# Patient Record
Sex: Female | Born: 1935 | Race: White | Hispanic: No | Marital: Single | Smoking: Former smoker
Health system: Southern US, Community
[De-identification: ages and names within clinical notes are randomized; demographics above are authoritative.]

## PROBLEM LIST (undated history)

## (undated) DIAGNOSIS — I10 Essential (primary) hypertension: Secondary | ICD-10-CM

## (undated) DIAGNOSIS — E785 Hyperlipidemia, unspecified: Secondary | ICD-10-CM

## (undated) DIAGNOSIS — I639 Cerebral infarction, unspecified: Secondary | ICD-10-CM

## (undated) DIAGNOSIS — J45909 Unspecified asthma, uncomplicated: Secondary | ICD-10-CM

## (undated) DIAGNOSIS — R569 Unspecified convulsions: Secondary | ICD-10-CM

## (undated) DIAGNOSIS — M1990 Unspecified osteoarthritis, unspecified site: Secondary | ICD-10-CM

## (undated) HISTORY — PX: HIP SURGERY: SHX245

## (undated) HISTORY — PX: ABDOMINAL HYSTERECTOMY: SHX81

## (undated) HISTORY — PX: APPENDECTOMY: SHX54

## (undated) HISTORY — DX: Unspecified asthma, uncomplicated: J45.909

## (undated) HISTORY — PX: OTHER SURGICAL HISTORY: SHX169

## (undated) HISTORY — DX: Unspecified osteoarthritis, unspecified site: M19.90

## (undated) HISTORY — DX: Hyperlipidemia, unspecified: E78.5

## (undated) HISTORY — DX: Unspecified convulsions: R56.9

## (undated) HISTORY — DX: Essential (primary) hypertension: I10

## (undated) HISTORY — DX: Cerebral infarction, unspecified: I63.9

---

## 2012-12-29 DIAGNOSIS — G40309 Generalized idiopathic epilepsy and epileptic syndromes, not intractable, without status epilepticus: Secondary | ICD-10-CM | POA: Insufficient documentation

## 2013-03-30 DIAGNOSIS — I10 Essential (primary) hypertension: Secondary | ICD-10-CM | POA: Insufficient documentation

## 2013-03-30 DIAGNOSIS — J45909 Unspecified asthma, uncomplicated: Secondary | ICD-10-CM | POA: Insufficient documentation

## 2013-03-30 DIAGNOSIS — N393 Stress incontinence (female) (male): Secondary | ICD-10-CM | POA: Insufficient documentation

## 2013-07-05 DIAGNOSIS — M2550 Pain in unspecified joint: Secondary | ICD-10-CM | POA: Insufficient documentation

## 2013-10-20 DIAGNOSIS — J45901 Unspecified asthma with (acute) exacerbation: Secondary | ICD-10-CM | POA: Insufficient documentation

## 2013-11-14 DIAGNOSIS — E782 Mixed hyperlipidemia: Secondary | ICD-10-CM | POA: Insufficient documentation

## 2013-11-14 DIAGNOSIS — N183 Chronic kidney disease, stage 3 unspecified: Secondary | ICD-10-CM | POA: Insufficient documentation

## 2013-11-14 DIAGNOSIS — I129 Hypertensive chronic kidney disease with stage 1 through stage 4 chronic kidney disease, or unspecified chronic kidney disease: Secondary | ICD-10-CM

## 2013-11-14 DIAGNOSIS — E039 Hypothyroidism, unspecified: Secondary | ICD-10-CM | POA: Insufficient documentation

## 2013-12-25 DIAGNOSIS — E669 Obesity, unspecified: Secondary | ICD-10-CM | POA: Insufficient documentation

## 2014-03-19 DIAGNOSIS — R001 Bradycardia, unspecified: Secondary | ICD-10-CM | POA: Insufficient documentation

## 2014-06-18 DIAGNOSIS — G4700 Insomnia, unspecified: Secondary | ICD-10-CM | POA: Insufficient documentation

## 2014-10-15 DIAGNOSIS — M159 Polyosteoarthritis, unspecified: Secondary | ICD-10-CM | POA: Insufficient documentation

## 2014-10-15 DIAGNOSIS — M150 Primary generalized (osteo)arthritis: Secondary | ICD-10-CM

## 2014-11-13 DIAGNOSIS — R9431 Abnormal electrocardiogram [ECG] [EKG]: Secondary | ICD-10-CM | POA: Insufficient documentation

## 2014-11-16 DIAGNOSIS — R768 Other specified abnormal immunological findings in serum: Secondary | ICD-10-CM | POA: Insufficient documentation

## 2014-12-21 DIAGNOSIS — R42 Dizziness and giddiness: Secondary | ICD-10-CM | POA: Insufficient documentation

## 2014-12-31 DIAGNOSIS — J449 Chronic obstructive pulmonary disease, unspecified: Secondary | ICD-10-CM | POA: Insufficient documentation

## 2015-03-04 DIAGNOSIS — R942 Abnormal results of pulmonary function studies: Secondary | ICD-10-CM | POA: Insufficient documentation

## 2016-03-26 ENCOUNTER — Other Ambulatory Visit: Payer: Self-pay | Admitting: Internal Medicine

## 2016-03-26 DIAGNOSIS — Z1231 Encounter for screening mammogram for malignant neoplasm of breast: Secondary | ICD-10-CM

## 2016-03-27 DIAGNOSIS — R159 Full incontinence of feces: Secondary | ICD-10-CM | POA: Insufficient documentation

## 2016-03-27 DIAGNOSIS — K625 Hemorrhage of anus and rectum: Secondary | ICD-10-CM | POA: Insufficient documentation

## 2016-04-30 ENCOUNTER — Ambulatory Visit
Admission: RE | Admit: 2016-04-30 | Discharge: 2016-04-30 | Disposition: A | Discharge status: Home / Self Care | Payer: Medicare Other | Source: Ambulatory Visit | Attending: Internal Medicine | Admitting: Internal Medicine

## 2016-04-30 DIAGNOSIS — Z1231 Encounter for screening mammogram for malignant neoplasm of breast: Secondary | ICD-10-CM | POA: Insufficient documentation

## 2016-06-04 ENCOUNTER — Other Ambulatory Visit: Payer: Self-pay | Admitting: Internal Medicine

## 2016-06-04 ENCOUNTER — Other Ambulatory Visit: Payer: Self-pay | Admitting: Legal Medicine

## 2016-06-04 DIAGNOSIS — N6320 Unspecified lump in the left breast, unspecified quadrant: Secondary | ICD-10-CM

## 2016-06-04 DIAGNOSIS — N6310 Unspecified lump in the right breast, unspecified quadrant: Secondary | ICD-10-CM

## 2016-06-19 ENCOUNTER — Ambulatory Visit
Admission: RE | Admit: 2016-06-19 | Discharge: 2016-06-19 | Disposition: A | Discharge status: Home / Self Care | Payer: Medicare Other | Source: Ambulatory Visit | Attending: Legal Medicine | Admitting: Legal Medicine

## 2016-06-19 DIAGNOSIS — N63 Unspecified lump in breast: Secondary | ICD-10-CM | POA: Diagnosis not present

## 2016-06-19 DIAGNOSIS — N6310 Unspecified lump in the right breast, unspecified quadrant: Secondary | ICD-10-CM

## 2016-06-19 DIAGNOSIS — N6320 Unspecified lump in the left breast, unspecified quadrant: Secondary | ICD-10-CM

## 2016-10-09 ENCOUNTER — Encounter: Payer: Self-pay | Admitting: Urology

## 2016-10-09 ENCOUNTER — Ambulatory Visit (INDEPENDENT_AMBULATORY_CARE_PROVIDER_SITE_OTHER): Payer: Medicare Other | Admitting: Urology

## 2016-10-09 VITALS — BP 150/82 | HR 76 | Ht 66.0 in | Wt 201.0 lb

## 2016-10-09 DIAGNOSIS — R32 Unspecified urinary incontinence: Secondary | ICD-10-CM

## 2016-10-09 DIAGNOSIS — R3915 Urgency of urination: Secondary | ICD-10-CM

## 2016-10-09 DIAGNOSIS — R351 Nocturia: Secondary | ICD-10-CM | POA: Diagnosis not present

## 2016-10-09 LAB — BLADDER SCAN AMB NON-IMAGING: Scan Result: 90

## 2016-10-09 NOTE — Progress Notes (Signed)
10/09/2016 5:44 PM   Cheryl SachsGeneva H Yankee 1936/03/15 [id number]  Referring provider: Jordan Hawksushad Darius Shroff, MD [address] [facility] [place], [place] [address]  Chief Complaint  Patient presents with  . New Patient (Initial Visit)    urinary incontinence referred by Orpah Melterushad Schroff    HPI: Patient is a 80 year old Caucasian female who is referred to us by, Dr. Octaviano GlowShroff, for urinary urgency.  Patient states that she has had urinary urgency for the a long time, but it has been gradually getting worse for the last two years.  She is not having incontinence.  She is not wearing pads.    She is having associated urinary frequency, urgency, nocturia x 6-8 and weak urinary stream.   She does not have a history of urinary tract infections, STI's or injury to the bladder.   She denies dysuria, gross hematuria, suprapubic pain, back pain, abdominal pain or flank pain. She has not had any recent fevers, chills, nausea or vomiting.   She does have a history of bladder taking in 1961, she does not have a history of nephrolithiasis or GU trauma.   She is post menopausal.   She feels she is not evacuating her bowels completely as she frequency finds stool smears in her underwear.    She is not having pain with bladder filling.    She has not had any recent imaging studies.    She has reduced her water intake in an effect to control her urgency.   She is drinking 1 to 2 caffeinated beverages daily.  She is not drinking alcoholic beverages.    Her risk factors for incontinence are obesity, age, caffeine, stroke, depression, fecal incontinence, vaginal atrophy and pelvic surgery.    She is taking OAB medication, ACE inhibitors, diuretics and antidepressants.    She is bothered a very great deal with a comfortable urge to urinate, sudden urge to urinate with little or no warning, waking up at night because she had to urinate and urine loss associated with a strong desire to urinate.     Her PVR is 90 mL.    PMH: Past Medical History:  Diagnosis Date  . Arthritis   . Asthma   . HLD (hyperlipidemia)   . HTN (hypertension)   . Seizures (HCC)   . Stroke [facility](HCC)     Surgical History: Past Surgical History:  Procedure Laterality Date  . ABDOMINAL HYSTERECTOMY     also one ovary taken  . APPENDECTOMY    . suspension of bladder     1961    Home Medications:    Medication List       Accurate as of 10/09/16 11:59 PM. Always use your most recent med list.          acetaminophen 500 MG tablet Commonly known as:  TYLENOL Take by mouth.   ADVAIR DISKUS 100-50 MCG/DOSE Aepb Generic drug:  Fluticasone-Salmeterol Inhale into the lungs.   albuterol 108 (90 Base) MCG/ACT inhaler Commonly known as:  PROVENTIL HFA;VENTOLIN HFA Inhale into the lungs.   ALIGN 4 MG Caps Take by mouth.   aspirin EC 81 MG tablet Take 81 mg by mouth.   Calcium Carbonate-Vitamin D3 600-400 MG-UNIT Tabs Take by mouth.   CENTRUM SILVER PO Take by mouth.   cetirizine 10 MG tablet Commonly known as:  ZYRTEC Take by mouth.   chlorhexidine 0.12 % solution Commonly known as:  PERIDEX as needed.   citalopram 10 MG tablet Commonly known  as:  CELEXA Take 10 mg by mouth.   Coenzyme Q10 200 MG capsule Take by mouth.   D 2000 2000 units Tabs Generic drug:  Cholecalciferol Take by mouth.   fesoterodine 4 MG Tb24 tablet Commonly known as:  TOVIAZ Take by mouth.   Garlic 1000 MG Caps Take by mouth.   Glucosamine-Chondroitin 500-400 MG Caps Take by mouth.   levETIRAcetam 250 MG tablet Commonly known as:  KEPPRA Take by mouth.   levothyroxine 88 MCG tablet Commonly known as:  SYNTHROID, LEVOTHROID Take by mouth.   losartan-hydrochlorothiazide 100-25 MG tablet Commonly known as:  HYZAAR Take by mouth.   MULTI-VITAMINS Tabs Take by mouth.   omeprazole 40 MG capsule Commonly known as:  PRILOSEC Take by mouth.   PSYLLIUM PO Take by mouth.   simvastatin 20  MG tablet Commonly known as:  ZOCOR Take 20 mg by mouth.   SM MULTIPLE VITAMINS/IRON Tabs Take by mouth.   sodium chloride 0.65 % nasal spray Commonly known as:  OCEAN Place into the nose.   Turmeric Curcumin 500 MG Caps Take by mouth.   vitamin E 400 UNIT capsule Take by mouth.       Allergies:  Allergies  Allergen Reactions  . Neomycin-Bacitracin Zn-Polymyx Hives    Family History: Family History  Problem Relation Age of Onset  . Breast cancer Neg Hx   . Prostate cancer Neg Hx   . Bladder Cancer Neg Hx   . Kidney cancer Neg Hx     Social History:  reports that she quit smoking about 50 years ago. She has never used smokeless tobacco. She reports that she drinks alcohol. She reports that she does not use drugs.  ROS: UROLOGY Frequent Urination?: Yes Hard to postpone urination?: Yes Burning/pain with urination?: No Get up at night to urinate?: Yes Leakage of urine?: Yes Urine stream starts and stops?: No Trouble starting stream?: No Do you have to strain to urinate?: No Blood in urine?: No Urinary tract infection?: No Sexually transmitted disease?: No Injury to kidneys or bladder?: No Painful intercourse?: No Weak stream?: Yes Currently pregnant?: No Vaginal bleeding?: No Last menstrual period?: n  Gastrointestinal Nausea?: No Vomiting?: No Indigestion/heartburn?: No Diarrhea?: No Constipation?: No  Constitutional Fever: No Night sweats?: No Weight loss?: No Fatigue?: No  Skin Skin rash/lesions?: No Itching?: No  Eyes Blurred vision?: No Double vision?: No  Ears/Nose/Throat Sore throat?: No Sinus problems?: No  Hematologic/Lymphatic Swollen glands?: No Easy bruising?: Yes  Cardiovascular Leg swelling?: No Chest pain?: No  Respiratory Cough?: Yes Shortness of breath?: No  Endocrine Excessive thirst?: No  Musculoskeletal Back pain?: No Joint pain?: Yes  Neurological Headaches?: No Dizziness?:  No  Psychologic Depression?: No Anxiety?: No  Physical Exam: BP (!) 150/82   Pulse 76   Ht 5\' 6"  (1.676 m)   Wt 201 lb (91.2 kg)   BMI 32.44 kg/m   Constitutional: Well nourished. Alert and oriented, No acute distress. HEENT: [place] AT, moist mucus membranes. Trachea midline, no masses. Cardiovascular: No clubbing, cyanosis, or edema. Respiratory: Normal respiratory effort, no increased work of breathing. GI: Abdomen is soft, non tender, non distended, no abdominal masses. Liver and spleen not palpable.  No hernias appreciated.  Stool sample for occult testing is not indicated.   GU: No CVA tenderness.  No bladder fullness or masses.  Atrophic external genitalia, normal pubic hair distribution, no lesions.  Normal urethral meatus, no lesions, no prolapse, no discharge.   No urethral masses, tenderness and/or  tenderness. No bladder fullness, tenderness or masses. Pale vagina mucosa, poor estrogen effect, no discharge, no lesions, good pelvic support, Grade I cystocele is noted.  Rectocele is noted.  Cervix and uterus are surgically absent.  No pelvic masses noted.  Anus and perineum are without rashes or lesions.    Skin: No rashes, bruises or suspicious lesions. Lymph: No cervical or inguinal adenopathy. Neurologic: Grossly intact, no focal deficits, moving all 4 extremities. Psychiatric: Normal mood and affect.   Pertinent Imaging: Results for Cheryl SachsDAVIS, Emmalynn H (MRN [id number]) as of 10/11/2016 17:38  Ref. Range 10/09/2016 09:48  Scan Result Unknown 90    Assessment & Plan:    1. Urgency  - offered behavioral therapies, bladder training, bladder control strategies, pelvic floor muscle training - patient declined  - fluid management - encouraged patient not to decrease her fluids  - offered medical therapy with anticholinergic therapy or beta-3 adrenergic receptor agonist and the potential side effects of each therapy   - would like to try the beta-3 adrenergic receptor agonist  (Myrbetriq).  Given Myrbetriq 25 mg samples, #28.  I have reviewed with the patient of the side effects of Myrbetriq, such as: elevation in BP, urinary retention and/or HA.  She will return in one month for PVR and symptom recheck.   - RTC in 3 weeks for PVR and symptom recheck   - BLADDER SCAN AMB NON-IMAGING  2. Nocturia  - I explained to the patient that nocturia is often multi-factorial and difficult to treat.  Sleeping disorders, heart conditions, peripheral vascular disease, diabetes, an enlarged prostate for men, an urethral stricture causing bladder outlet obstruction and/or certain medications can contribute to nocturia.  - I have suggested that the patient avoid caffeine after noon and alcohol in the evening.  He or she may also benefit from fluid restrictions after 6:00 in the evening and voiding just prior to bedtime.  - I have explained that research studies have showed that over 84% of patients with sleep apnea reported frequent nighttime urination.   With sleep apnea, oxygen decreases, carbon dioxide increases, the blood become more acidic, the heart rate drops and blood vessels in the lung constrict.  The body is then alerted that something is very wrong. The sleeper must wake enough to reopen the airway. By this time, the heart is racing and experiences a false signal of fluid overload. The heart excretes a hormone-like protein that tells the body to get rid of sodium and water, resulting in nocturia.  -  I also informed the patient that a recent study noted that decreasing sodium intake to 2.3 grams daily, if they don't have issues with hyponatremia, can also reduce the number of nightly voids  - The patient may benefit from a discussion with his or her primary care physician to see if he or she has risk factors for sleep apnea or other sleep disturbances and obtaining a sleep study.   Return in about 3 weeks (around 10/30/2016) for PVR and OAB questionnaire.  These notes generated  with voice recognition software. I apologize for typographical errors.  Michiel CowboySHANNON Vaughn Beaumier, PA-C  [facility] [address], [address] [place], [place] [address] [phone]

## 2016-11-06 ENCOUNTER — Ambulatory Visit: Payer: Medicare Other | Admitting: Urology

## 2016-11-13 ENCOUNTER — Encounter: Payer: Self-pay | Admitting: Urology

## 2016-11-13 ENCOUNTER — Ambulatory Visit (INDEPENDENT_AMBULATORY_CARE_PROVIDER_SITE_OTHER): Payer: Medicare Other | Admitting: Urology

## 2016-11-13 ENCOUNTER — Telehealth: Payer: Self-pay | Admitting: Urology

## 2016-11-13 VITALS — BP 139/77 | HR 83 | Ht 66.0 in | Wt 205.0 lb

## 2016-11-13 DIAGNOSIS — R351 Nocturia: Secondary | ICD-10-CM

## 2016-11-13 DIAGNOSIS — R3915 Urgency of urination: Secondary | ICD-10-CM

## 2016-11-13 LAB — BLADDER SCAN AMB NON-IMAGING: SCAN RESULT: 88

## 2016-11-13 NOTE — Progress Notes (Signed)
11/13/2016 9:06 AM   Cheryl Huff 1936-02-03 [id number]  Referring provider: Jordan Hawks, MD [address] [place] [address] [place], [place] [address]  Chief Complaint  Patient presents with  . Follow-up    3 weeks for urinary urgency and nocturia    HPI: Patient is a 81 year old Caucasian female who presents today for a 3 week follow up after a trial of Toviaz for her urinary urgency and nocturia.    Background history Patient was referred to [place] by, Dr. Octaviano Glow, for urinary urgency.  Patient states that she has had urinary urgency for the a long time, but it has been gradually getting worse for the last two years.  She is not having incontinence.  She is not wearing pads.  She is having associated urinary frequency, urgency, nocturia x 6-8 and weak urinary stream.   She does not have a history of urinary tract infections, STI's or injury to the bladder.   She denies dysuria, gross hematuria, suprapubic pain, back pain, abdominal pain or flank pain.  She has not had any recent fevers, chills, nausea or vomiting.  She does have a history of bladder taking in 1961, she does not have a history of nephrolithiasis or GU trauma.   She is post menopausal.  She feels she is not evacuating her bowels completely as she frequency finds stool smears in her underwear.  She is not having pain with bladder filling.  She has not had any recent imaging studies.  She has reduced her water intake in an effect to control her urgency.   She is drinking 1 to 2 caffeinated beverages daily.  She is not drinking alcoholic beverages.  Her risk factors for incontinence are obesity, age, caffeine, stroke, depression, fecal incontinence, vaginal atrophy and pelvic surgery.  She is taking OAB medication, ACE inhibitors, diuretics and antidepressants.  She is bothered a very great deal with a comfortable urge to urinate, sudden urge to urinate with little or no warning, waking up at night because she had to  urinate and urine loss associated with a strong desire to urinate.  Her PVR is 90 mL.    At her visit three weeks ago, she was started on Toviaz daily.  She showed signs of confusion and forgetfulness at today visit.  She did not recall having been to the office three weeks ago.    Today, she states that 7 times a day she needs to rush to the bathroom to urinate worried that she will make it, she makes 7 trips to the bathroom daily to urinate, she restricts her fluid intake 3 days a week in an effort to stave off urination, she does not engage in toilet mapping, she's had 3 episodes of incontinence this week and gets up 7 times a night to urinate.  She states the Gala Murdoch did not provide any improvement in her urinary symptoms.  She states she did not experience an increase in her dry mouth or constipation.  Her PVR today is 80 mL.    She perseverates on her bowel issues and it is difficult to keep her focus on her urinary issues.    She was also instructed to speak with Dr. Octaviano Glow about her sleep apnea.  She has not discussed her sleep apnea issues with Dr. Octaviano Glow.   PMH: Past Medical History:  Diagnosis Date  . Arthritis   . Asthma   . HLD (hyperlipidemia)   . HTN (hypertension)   .  Seizures (HCC)   . Stroke [facility])     Surgical History: Past Surgical History:  Procedure Laterality Date  . ABDOMINAL HYSTERECTOMY     also one ovary taken  . APPENDECTOMY    . HIP SURGERY Right    3 pins put in from fall cracking hip  . suspension of bladder     1961    Home Medications:  Allergies as of 11/13/2016      Reactions   Neomycin-bacitracin Zn-polymyx Hives      Medication List       Accurate as of 11/13/16  9:06 AM. Always use your most recent med list.          acetaminophen 500 MG tablet Commonly known as:  TYLENOL Take by mouth.   ADVAIR DISKUS 100-50 MCG/DOSE Aepb Generic drug:  Fluticasone-Salmeterol Inhale into the lungs.   albuterol 108 (90 Base) MCG/ACT  inhaler Commonly known as:  PROVENTIL HFA;VENTOLIN HFA Inhale into the lungs.   ALIGN 4 MG Caps Take by mouth.   aspirin EC 81 MG tablet Take 81 mg by mouth.   Calcium Carbonate-Vitamin D3 600-400 MG-UNIT Tabs Take by mouth.   CENTRUM SILVER PO Take by mouth.   cetirizine 10 MG tablet Commonly known as:  ZYRTEC Take by mouth.   chlorhexidine 0.12 % solution Commonly known as:  PERIDEX as needed.   citalopram 10 MG tablet Commonly known as:  CELEXA Take 10 mg by mouth.   Coenzyme Q10 200 MG capsule Take by mouth.   D 2000 2000 units Tabs Generic drug:  Cholecalciferol Take by mouth.   fesoterodine 4 MG Tb24 tablet Commonly known as:  TOVIAZ Take by mouth.   Garlic 1000 MG Caps Take by mouth.   Glucosamine-Chondroitin 500-400 MG Caps Take by mouth.   levETIRAcetam 250 MG tablet Commonly known as:  KEPPRA Take by mouth.   levothyroxine 88 MCG tablet Commonly known as:  SYNTHROID, LEVOTHROID Take by mouth.   losartan-hydrochlorothiazide 100-25 MG tablet Commonly known as:  HYZAAR Take by mouth.   MULTI-VITAMINS Tabs Take by mouth.   omeprazole 40 MG capsule Commonly known as:  PRILOSEC Take by mouth.   PSYLLIUM PO Take by mouth.   simvastatin 20 MG tablet Commonly known as:  ZOCOR Take 20 mg by mouth.   SM MULTIPLE VITAMINS/IRON Tabs Take by mouth.   sodium chloride 0.65 % nasal spray Commonly known as:  OCEAN Place into the nose.   Turmeric Curcumin 500 MG Caps Take by mouth.   vitamin E 400 UNIT capsule Take by mouth.       Allergies:  Allergies  Allergen Reactions  . Neomycin-Bacitracin Zn-Polymyx Hives    Family History: Family History  Problem Relation Age of Onset  . Pancreatic cancer Mother   . Breast cancer Neg Hx   . Prostate cancer Neg Hx   . Bladder Cancer Neg Hx   . Kidney cancer Neg Hx     Social History:  reports that she quit smoking about 51 years ago. She has never used smokeless tobacco. She reports  that she drinks alcohol. She reports that she does not use drugs.  ROS: UROLOGY Frequent Urination?: Yes Hard to postpone urination?: Yes Burning/pain with urination?: No Get up at night to urinate?: Yes Leakage of urine?: Yes Urine stream starts and stops?: Yes Trouble starting stream?: No Do you have to strain to urinate?: No Blood in urine?: No Urinary tract infection?: No Sexually transmitted disease?: No Injury to kidneys or  bladder?: No Painful intercourse?: No Weak stream?: No Currently pregnant?: No Vaginal bleeding?: No Last menstrual period?: n  Gastrointestinal Nausea?: No Vomiting?: No Indigestion/heartburn?: No Diarrhea?: No Constipation?: No  Constitutional Fever: No Night sweats?: No Weight loss?: No Fatigue?: No  Skin Skin rash/lesions?: No Itching?: No  Eyes Blurred vision?: No Double vision?: No  Ears/Nose/Throat Sore throat?: No Sinus problems?: No  Hematologic/Lymphatic Swollen glands?: No Easy bruising?: No  Cardiovascular Leg swelling?: No Chest pain?: No  Respiratory Cough?: Yes Shortness of breath?: Yes  Endocrine Excessive thirst?: Yes  Musculoskeletal Back pain?: No Joint pain?: Yes  Neurological Headaches?: No Dizziness?: Yes  Psychologic Depression?: No Anxiety?: No  Physical Exam: BP 139/77   Pulse 83   Ht 5\' 6"  (1.676 m)   Wt 205 lb (93 kg)   BMI 33.09 kg/m   Constitutional: Well nourished. Alert and oriented, No acute distress. HEENT: [place] AT, moist mucus membranes. Trachea midline, no masses. Cardiovascular: No clubbing, cyanosis, or edema. Respiratory: Normal respiratory effort, no increased work of breathing. Skin: No rashes, bruises or suspicious lesions. Lymph: No cervical or inguinal adenopathy. Neurologic: Grossly intact, no focal deficits, moving all 4 extremities. Psychiatric: Normal mood and affect.   Pertinent Imaging: Results for Cheryl SachsDAVIS, Sabella H (MRN [id number]) as of 11/13/2016  09:00  Ref. Range 11/13/2016 08:44  Scan Result Unknown 88     Assessment & Plan:    1. Urgency  - failed Toviaz 4 mg daily  - does not want to try another medication at this time as she is fearful she may not urinate at all  - BLADDER SCAN AMB NON-IMAGING  - will reassess once she has her sleep study  2. Nocturia  - still has not spoken to her PCP regarding her possible sleep apnea  - will reassess once she has her sleep study  Return for follow up pending sleep study.  These notes generated with voice recognition software. I apologize for typographical errors.  Michiel CowboySHANNON Kenard Morawski, PA-C  [facility] [address], [address] [place], [place] [address] [phone]

## 2016-11-13 NOTE — Telephone Encounter (Signed)
DONE

## 2016-11-13 NOTE — Telephone Encounter (Signed)
Will you send my last two notes to Dr. Octaviano GlowShroff?

## 2016-11-13 NOTE — Patient Instructions (Addendum)
Pelvic Organ Prolapse Introduction Pelvic organ prolapse is the stretching, bulging, or dropping of pelvic organs into an abnormal position. It happens when the muscles and tissues that surround and support pelvic structures are stretched or weak. Pelvic organ prolapse can involve:  Vagina (vaginal prolapse).  Uterus (uterine prolapse).  Bladder (cystocele).  Rectum (rectocele).  Intestines (enterocele). When organs other than the vagina are involved, they often bulge into the vagina or protrude from the vagina, depending on how severe the prolapse is. What are the causes? Causes of this condition include:  Pregnancy, labor, and childbirth.  Long-lasting (chronic) cough.  Chronic constipation.  Obesity.  Past pelvic surgery.  Aging. During and after menopause, a decreased production of the hormone estrogen can weaken pelvic ligaments and muscles.  Consistently lifting more than 50 lb (23 kg).  Buildup of fluid in the abdomen due to certain diseases and other conditions. What are the signs or symptoms? Symptoms of this condition include:  Loss of bladder control when you cough, sneeze, strain, and exercise (stress incontinence). This may be worse immediately following childbirth, and it may gradually improve over time.  Feeling pressure in your pelvis or vagina. This pressure may increase when you cough or when you are having a bowel movement.  A bulge that protrudes from the opening of your vagina or against your vaginal wall. If your uterus protrudes through the opening of your vagina and rubs against your clothing, you may also experience soreness, ulcers, infection, pain, and bleeding.  Increased effort to have a bowel movement or urinate.  Pain in your low back.  Pain, discomfort, or disinterest in sexual intercourse.  Repeated bladder infections (urinary tract infections).  Difficulty inserting or inability to insert a tampon or applicator. In some people, this  condition does not cause any symptoms. How is this diagnosed? Your health care provider may perform an internal and external vaginal and rectal exam. During the exam, you may be asked to cough and strain while you are lying down, sitting, and standing up. Your health care provider will determine if other tests are required, such as bladder function tests. How is this treated? In most cases, this condition needs to be treated only if it produces symptoms. No treatment is guaranteed to correct the prolapse or relieve the symptoms completely. Treatment may include:  Lifestyle changes, such as:  Avoiding drinking beverages that contain caffeine.  Increasing your intake of high-fiber foods. This can help to decrease constipation and straining during bowel movements.  Emptying your bladder at scheduled times (bladder training therapy). This can help to reduce or avoid urinary incontinence.  Losing weight if you are overweight or obese.  Estrogen. Estrogen may help mild prolapse by increasing the strength and tone of pelvic floor muscles.  Kegel exercises. These may help mild cases of prolapse by strengthening and tightening the muscles of the pelvic floor.  Pessary insertion. A pessary is a soft, flexible device that is placed into your vagina by your health care provider to help support the vaginal walls and keep pelvic organs in place.  Surgery. This is often the only form of treatment for severe prolapse. Different types of surgeries are available. Follow these instructions at home:  Wear a sanitary pad or absorbent product if you have urinary incontinence.  Avoid heavy lifting and straining with exercise and work. Do not hold your breath when you perform mild to moderate lifting and exercise activities. Limit your activities as directed by your health care provider.  Take   medicines only as directed by your health care provider.  Perform Kegel exercises as directed by your health care  provider.  If you have a pessary, take care of it as directed by your health care provider. Contact a health care provider if:  Your symptoms interfere with your daily activities or sex life.  You need medicine to help with the discomfort.  You notice bleeding from the vagina that is not related to your period.  You have a fever.  You have pain or bleeding when you urinate.  You have bleeding when you have a bowel movement.  You lose urine when you have sex.  You have chronic constipation.  You have a pessary that falls out.  You have vaginal discharge that has a bad smell.  You have low abdominal pain or cramping that is unusual for you. This information is not intended to replace advice given to you by your health care provider. Make sure you discuss any questions you have with your health care provider. Document Released: 05/16/2014 Document Revised: 03/26/2016 Document Reviewed: 01/01/2014  2017 [employer]vier  

## 2016-11-24 ENCOUNTER — Ambulatory Visit: Payer: Medicare Other | Admitting: Gastroenterology

## 2016-11-26 ENCOUNTER — Ambulatory Visit (INDEPENDENT_AMBULATORY_CARE_PROVIDER_SITE_OTHER): Payer: Medicare Other | Admitting: Gastroenterology

## 2016-11-26 ENCOUNTER — Encounter: Payer: Self-pay | Admitting: Gastroenterology

## 2016-11-26 VITALS — BP 142/73 | HR 72 | Temp 98.0°F | Ht 66.0 in | Wt 207.0 lb

## 2016-11-26 DIAGNOSIS — F329 Major depressive disorder, single episode, unspecified: Secondary | ICD-10-CM | POA: Insufficient documentation

## 2016-11-26 DIAGNOSIS — R194 Change in bowel habit: Secondary | ICD-10-CM | POA: Diagnosis not present

## 2016-11-26 DIAGNOSIS — E039 Hypothyroidism, unspecified: Secondary | ICD-10-CM | POA: Insufficient documentation

## 2016-11-26 DIAGNOSIS — K219 Gastro-esophageal reflux disease without esophagitis: Secondary | ICD-10-CM | POA: Insufficient documentation

## 2016-11-26 DIAGNOSIS — J449 Chronic obstructive pulmonary disease, unspecified: Secondary | ICD-10-CM | POA: Insufficient documentation

## 2016-11-26 DIAGNOSIS — F419 Anxiety disorder, unspecified: Secondary | ICD-10-CM | POA: Insufficient documentation

## 2016-11-26 DIAGNOSIS — M1990 Unspecified osteoarthritis, unspecified site: Secondary | ICD-10-CM | POA: Insufficient documentation

## 2016-11-26 DIAGNOSIS — F32A Depression, unspecified: Secondary | ICD-10-CM | POA: Insufficient documentation

## 2016-11-26 DIAGNOSIS — G40909 Epilepsy, unspecified, not intractable, without status epilepticus: Secondary | ICD-10-CM | POA: Insufficient documentation

## 2016-11-26 DIAGNOSIS — E876 Hypokalemia: Secondary | ICD-10-CM | POA: Insufficient documentation

## 2016-11-26 DIAGNOSIS — T7840XA Allergy, unspecified, initial encounter: Secondary | ICD-10-CM | POA: Insufficient documentation

## 2016-11-26 DIAGNOSIS — R197 Diarrhea, unspecified: Secondary | ICD-10-CM | POA: Diagnosis not present

## 2016-11-26 DIAGNOSIS — E785 Hyperlipidemia, unspecified: Secondary | ICD-10-CM | POA: Insufficient documentation

## 2016-11-26 NOTE — Progress Notes (Signed)
Gastroenterology Consultation  Referring Provider:     Jordan Hawks, * Primary Care Physician:  [facility], Clancy Gourd, MD Primary Gastroenterologist:  Dr. Servando Snare     Reason for Consultation:     Change in bowel habits        HPI:   Cheryl Huff is a 81 y.o. y/o female referred for consultation & management of Change in bowel habits by Dr. Octaviano Glow, Clancy Gourd, MD.  This patient comes today with a report of change in bowel habits. The patient states that she has had loose bowel movements for the last year off and on. She states that she has not had any problems for last 3 weeks. The patient has been taking some fiber wafer supplementation and states that the taste is bad and she doesn't take it as often as she should. The patient has not had any weight loss black stools bloody stools nausea or vomiting. The patient take she had a colonoscopy 52 and then again at 62 but if she is not sure. She states that she was told she doesn't need any further colonoscopies. It is no report of any family history of colon cancer colon polyps. The patient denies any abdominal pain associated with her loose bowel movements. The patient has had some small amount of feces on her underwear that she says has slipped out without her knowing. She also reports that when she has a soft stools she has to wipe multiple times to get clean.  Past Medical History:  Diagnosis Date  . Arthritis   . Asthma   . HLD (hyperlipidemia)   . HTN (hypertension)   . Seizures (HCC)   . Stroke [facility])     Past Surgical History:  Procedure Laterality Date  . ABDOMINAL HYSTERECTOMY     also one ovary taken  . APPENDECTOMY    . HIP SURGERY Right    3 pins put in from fall cracking hip  . suspension of bladder     1961    Prior to Admission medications   Medication Sig Start Date End Date Taking? Authorizing Provider  acetaminophen (TYLENOL) 500 MG tablet Take by mouth.   Yes Historical Provider, MD  albuterol  (PROVENTIL HFA;VENTOLIN HFA) 108 (90 Base) MCG/ACT inhaler Inhale into the lungs. 10/20/13  Yes Historical Provider, MD  aspirin EC 81 MG tablet Take 81 mg by mouth.   Yes Historical Provider, MD  Calcium Carbonate-Vitamin D3 600-400 MG-UNIT TABS Take by mouth.   Yes Historical Provider, MD  cetirizine (ZYRTEC) 10 MG tablet Take by mouth.   Yes Historical Provider, MD  chlorhexidine (PERIDEX) 0.12 % solution as needed. 09/05/13  Yes Historical Provider, MD  Cholecalciferol (D 2000) 2000 units TABS Take by mouth.   Yes Historical Provider, MD  citalopram (CELEXA) 10 MG tablet Take 10 mg by mouth. 06/18/14  Yes Historical Provider, MD  Coenzyme Q10 200 MG capsule Take by mouth.   Yes Historical Provider, MD  fesoterodine (TOVIAZ) 4 MG TB24 tablet Take by mouth. 06/19/13  Yes Historical Provider, MD  Fluticasone-Salmeterol (ADVAIR DISKUS) 100-50 MCG/DOSE AEPB Inhale into the lungs. 10/20/13  Yes Historical Provider, MD  Garlic 1000 MG CAPS Take by mouth.   Yes Historical Provider, MD  Glucosamine-Chondroitin 500-400 MG CAPS Take by mouth.   Yes Historical Provider, MD  levETIRAcetam (KEPPRA) 250 MG tablet Take by mouth. 08/04/16  Yes Historical Provider, MD  levothyroxine (SYNTHROID, LEVOTHROID) 88 MCG tablet Take by mouth. 08/04/16  Yes Historical  Provider, MD  losartan-hydrochlorothiazide (HYZAAR) 100-25 MG tablet Take by mouth. 08/04/16  Yes Historical Provider, MD  Multiple Vitamin (MULTI-VITAMINS) TABS Take by mouth.   Yes Historical Provider, MD  Multiple Vitamins-Minerals (CENTRUM SILVER PO) Take by mouth.   Yes Historical Provider, MD  omeprazole (PRILOSEC) 40 MG capsule Take by mouth. 08/04/16  Yes Historical Provider, MD  Probiotic Product (ALIGN) 4 MG CAPS Take by mouth.   Yes Historical Provider, MD  PSYLLIUM PO Take by mouth.   Yes Historical Provider, MD  simvastatin (ZOCOR) 20 MG tablet Take 20 mg by mouth. 06/18/14  Yes Historical Provider, MD  SM MULTIPLE VITAMINS/IRON TABS Take by mouth.    Yes Historical Provider, MD  sodium chloride (OCEAN) 0.65 % nasal spray Place into the nose.   Yes Historical Provider, MD  Turmeric Curcumin 500 MG CAPS Take by mouth.   Yes Historical Provider, MD  vitamin E 400 UNIT capsule Take by mouth.   Yes Historical Provider, MD    Family History  Problem Relation Age of Onset  . Pancreatic cancer Mother   . Breast cancer Neg Hx   . Prostate cancer Neg Hx   . Bladder Cancer Neg Hx   . Kidney cancer Neg Hx      Social History  Substance Use Topics  . Smoking status: Former Smoker    Quit date: 11/02/1965  . Smokeless tobacco: Never Used  . Alcohol use Yes     Comment: occ    Allergies as of 11/26/2016 - Review Complete 11/26/2016  Allergen Reaction Noted  . Neomycin-bacitracin zn-polymyx Hives 11/14/2013    Review of Systems:    All systems reviewed and negative except where noted in HPI.   Physical Exam:  BP (!) 142/73   Pulse 72   Temp 98 F (36.7 C) (Oral)   Ht 5\' 6"  (1.676 m)   Wt 207 lb (93.9 kg)   BMI 33.41 kg/m  No LMP recorded. Patient has had a hysterectomy. Psych:  Alert and cooperative. Normal mood and affect. General:   Alert,  Well-developed, well-nourished, pleasant and cooperative in NAD Head:  Normocephalic and atraumatic. Eyes:  Sclera clear, no icterus.   Conjunctiva pink. Ears:  Normal auditory acuity. Nose:  No deformity, discharge, or lesions. Mouth:  No deformity or lesions,oropharynx pink & moist. Neck:  Supple; no masses or thyromegaly. Lungs:  Respirations even and unlabored.  Clear throughout to auscultation.   No wheezes, crackles, or rhonchi. No acute distress. Heart:  Regular rate and rhythm; no murmurs, clicks, rubs, or gallops. Abdomen:  Normal bowel sounds.  No bruits.  Soft, non-tender and non-distended without masses, hepatosplenomegaly or hernias noted.  No guarding or rebound tenderness.  Negative Carnett sign.   Rectal:  Deferred.  Msk:  Symmetrical without gross deformities.  Good,  equal movement & strength bilaterally. Pulses:  Normal pulses noted. Extremities:  No clubbing or edema.  No cyanosis. Neurologic:  Alert and oriented x3;  grossly normal neurologically. Skin:  Intact without significant lesions or rashes.  No jaundice. Lymph Nodes:  No significant cervical adenopathy. Psych:  Alert and cooperative. Normal mood and affect.  Imaging Studies: No results found.  Assessment and Plan:   Cheryl Huff is a 81 y.o. y/o female who comes in today with a history of off and on loose stools. The patient reports that she does drink a lot of milk and lots milk. She has been told that there products can cause loose bowel movements. The patient  also reports that she seems to be better with fiber which leads me to think that this is not an organic disease. She also has the symptoms, and go which is more likely related to something she is exposed to likely milk products or something she is eating. The patient has been given samples of powdered Metamucil to see if this is more tolerable to take. She will continue the fiber daily and see if her symptoms stay at day. If they do not she has been told to call here and she may need a colonoscopy if she does not improve. The patient has been explained the plan and agrees with it.    Midge Miniumarren Amenda Duclos, MD. Clementeen GrahamFACG   Note: This dictation was prepared with Dragon dictation along with smaller phrase technology. Any transcriptional errors that result from this process are unintentional.

## 2017-03-18 ENCOUNTER — Inpatient Hospital Stay
Admission: EM | Admit: 2017-03-18 | Discharge: 2017-03-20 | DRG: 641 | Disposition: A | Discharge status: Home / Self Care | Payer: Medicare Other | Attending: Internal Medicine | Admitting: Internal Medicine

## 2017-03-18 ENCOUNTER — Emergency Department: Payer: Medicare Other

## 2017-03-18 ENCOUNTER — Encounter: Payer: Self-pay | Admitting: Emergency Medicine

## 2017-03-18 DIAGNOSIS — J449 Chronic obstructive pulmonary disease, unspecified: Secondary | ICD-10-CM | POA: Diagnosis present

## 2017-03-18 DIAGNOSIS — Z800 Family history of malignant neoplasm of digestive organs: Secondary | ICD-10-CM

## 2017-03-18 DIAGNOSIS — I130 Hypertensive heart and chronic kidney disease with heart failure and stage 1 through stage 4 chronic kidney disease, or unspecified chronic kidney disease: Secondary | ICD-10-CM | POA: Diagnosis present

## 2017-03-18 DIAGNOSIS — Z881 Allergy status to other antibiotic agents status: Secondary | ICD-10-CM | POA: Diagnosis not present

## 2017-03-18 DIAGNOSIS — Z90710 Acquired absence of both cervix and uterus: Secondary | ICD-10-CM

## 2017-03-18 DIAGNOSIS — R0602 Shortness of breath: Secondary | ICD-10-CM

## 2017-03-18 DIAGNOSIS — Z8673 Personal history of transient ischemic attack (TIA), and cerebral infarction without residual deficits: Secondary | ICD-10-CM | POA: Diagnosis not present

## 2017-03-18 DIAGNOSIS — E782 Mixed hyperlipidemia: Secondary | ICD-10-CM | POA: Diagnosis present

## 2017-03-18 DIAGNOSIS — E039 Hypothyroidism, unspecified: Secondary | ICD-10-CM | POA: Diagnosis present

## 2017-03-18 DIAGNOSIS — N3281 Overactive bladder: Secondary | ICD-10-CM | POA: Diagnosis present

## 2017-03-18 DIAGNOSIS — G40909 Epilepsy, unspecified, not intractable, without status epilepticus: Secondary | ICD-10-CM | POA: Diagnosis present

## 2017-03-18 DIAGNOSIS — N183 Chronic kidney disease, stage 3 (moderate): Secondary | ICD-10-CM | POA: Diagnosis present

## 2017-03-18 DIAGNOSIS — E785 Hyperlipidemia, unspecified: Secondary | ICD-10-CM | POA: Diagnosis present

## 2017-03-18 DIAGNOSIS — E876 Hypokalemia: Secondary | ICD-10-CM | POA: Diagnosis present

## 2017-03-18 DIAGNOSIS — Z79899 Other long term (current) drug therapy: Secondary | ICD-10-CM

## 2017-03-18 DIAGNOSIS — Z7951 Long term (current) use of inhaled steroids: Secondary | ICD-10-CM | POA: Diagnosis not present

## 2017-03-18 DIAGNOSIS — E871 Hypo-osmolality and hyponatremia: Principal | ICD-10-CM

## 2017-03-18 DIAGNOSIS — I5022 Chronic systolic (congestive) heart failure: Secondary | ICD-10-CM | POA: Diagnosis present

## 2017-03-18 DIAGNOSIS — I5021 Acute systolic (congestive) heart failure: Secondary | ICD-10-CM

## 2017-03-18 DIAGNOSIS — E861 Hypovolemia: Secondary | ICD-10-CM | POA: Diagnosis present

## 2017-03-18 DIAGNOSIS — Z87891 Personal history of nicotine dependence: Secondary | ICD-10-CM | POA: Diagnosis not present

## 2017-03-18 DIAGNOSIS — K219 Gastro-esophageal reflux disease without esophagitis: Secondary | ICD-10-CM | POA: Diagnosis present

## 2017-03-18 DIAGNOSIS — Z7982 Long term (current) use of aspirin: Secondary | ICD-10-CM

## 2017-03-18 DIAGNOSIS — E860 Dehydration: Secondary | ICD-10-CM | POA: Diagnosis present

## 2017-03-18 DIAGNOSIS — F329 Major depressive disorder, single episode, unspecified: Secondary | ICD-10-CM | POA: Diagnosis present

## 2017-03-18 LAB — BASIC METABOLIC PANEL
ANION GAP: 13 (ref 5–15)
Anion gap: 10 (ref 5–15)
BUN: 14 mg/dL (ref 6–20)
BUN: 12 mg/dL (ref 6–20)
CO2: 25 mmol/L (ref 22–32)
CO2: 30 mmol/L (ref 22–32)
CREATININE: 0.91 mg/dL (ref 0.44–1.00)
Calcium: 8.3 mg/dL — ABNORMAL LOW (ref 8.9–10.3)
Calcium: 8.4 mg/dL — ABNORMAL LOW (ref 8.9–10.3)
Chloride: 82 mmol/L — ABNORMAL LOW (ref 101–111)
Chloride: 83 mmol/L — ABNORMAL LOW (ref 101–111)
Creatinine, Ser: 0.73 mg/dL (ref 0.44–1.00)
GFR calc Af Amer: >60 mL/min (ref >60)
GFR calc Af Amer: >60 mL/min (ref >60)
GFR, EST NON AFRICAN AMERICAN: 59 mL/min — AB (ref >60)
GLUCOSE: 110 mg/dL — AB (ref 65–99)
Glucose, Bld: 163 mg/dL — ABNORMAL HIGH (ref 65–99)
POTASSIUM: 2.7 mmol/L — AB (ref 3.5–5.1)
Potassium: 3 mmol/L — ABNORMAL LOW (ref 3.5–5.1)
SODIUM: 120 mmol/L — AB (ref 135–145)
SODIUM: 123 mmol/L — AB (ref 135–145)

## 2017-03-18 LAB — CBC WITH DIFFERENTIAL/PLATELET
BASOS ABS: 0 10*3/uL (ref 0–0.1)
Basophils Relative: 0 %
EOS ABS: 0.3 10*3/uL (ref 0–0.7)
Eosinophils Relative: 3 %
HCT: 33.4 % — ABNORMAL LOW (ref 35.0–47.0)
Hemoglobin: 11.7 g/dL — ABNORMAL LOW (ref 12.0–16.0)
Lymphocytes Relative: 4 %
Lymphs Abs: 0.4 10*3/uL — ABNORMAL LOW (ref 1.0–3.6)
MCH: 30.2 pg (ref 26.0–34.0)
MCHC: 35.2 g/dL (ref 32.0–36.0)
MCV: 85.8 fL (ref 80.0–100.0)
MONO ABS: 1.2 10*3/uL — AB (ref 0.2–0.9)
Monocytes Relative: 12 %
Neutro Abs: 8.4 10*3/uL — ABNORMAL HIGH (ref 1.4–6.5)
Neutrophils Relative %: 81 %
Platelets: 225 10*3/uL (ref 150–440)
RBC: 3.89 MIL/uL (ref 3.80–5.20)
RDW: 13.3 % (ref 11.5–14.5)
WBC: 10.4 10*3/uL (ref 3.6–11.0)

## 2017-03-18 LAB — TROPONIN I: Troponin I: <0.03 ng/mL (ref <0.03)

## 2017-03-18 LAB — BRAIN NATRIURETIC PEPTIDE: B NATRIURETIC PEPTIDE 5: 268 pg/mL — AB (ref 0.0–100.0)

## 2017-03-18 MED ORDER — ONDANSETRON HCL 4 MG/2ML IJ SOLN: 4.0000 mg | Freq: Four times a day (QID) | INTRAMUSCULAR | Status: DC | PRN

## 2017-03-18 MED ORDER — GARLIC 1000 MG PO CAPS: 2000.0000 mg | ORAL_CAPSULE | Freq: Every day | ORAL | Status: DC

## 2017-03-18 MED ORDER — LEVETIRACETAM 500 MG PO TABS
500.0000 mg | ORAL_TABLET | Freq: Two times a day (BID) | ORAL | Status: DC
  Administered 2017-03-18 – 2017-03-20 (×4): 500 mg via ORAL
  Filled 2017-03-18 (×4): qty 1

## 2017-03-18 MED ORDER — COENZYME Q10 200 MG PO CAPS: 200.0000 mg | ORAL_CAPSULE | Freq: Every day | ORAL | Status: DC

## 2017-03-18 MED ORDER — CITALOPRAM HYDROBROMIDE 20 MG PO TABS
10.0000 mg | ORAL_TABLET | Freq: Every evening | ORAL | Status: DC
Start: 2017-03-18 — End: 2017-03-20
  Administered 2017-03-19: 10 mg via ORAL
  Filled 2017-03-18: qty 1

## 2017-03-18 MED ORDER — NAPROXEN 250 MG PO TABS
250.0000 mg | ORAL_TABLET | Freq: Every day | ORAL | Status: DC
  Administered 2017-03-18 – 2017-03-20 (×3): 250 mg via ORAL
  Filled 2017-03-18 (×3): qty 1

## 2017-03-18 MED ORDER — GLUCOSAMINE-CHONDROITIN 500-400 MG PO CAPS: 1.0000 | ORAL_CAPSULE | Freq: Three times a day (TID) | ORAL | Status: DC

## 2017-03-18 MED ORDER — IPRATROPIUM-ALBUTEROL 0.5-2.5 (3) MG/3ML IN SOLN
3.0000 mL | Freq: Once | RESPIRATORY_TRACT | Status: AC
Start: 2017-03-18 — End: 2017-03-18
  Administered 2017-03-18: 3 mL via RESPIRATORY_TRACT
  Filled 2017-03-18: qty 3

## 2017-03-18 MED ORDER — RISAQUAD PO CAPS
1.0000 | ORAL_CAPSULE | Freq: Every day | ORAL | Status: DC
  Administered 2017-03-18 – 2017-03-20 (×3): 1 via ORAL
  Filled 2017-03-18 (×3): qty 1

## 2017-03-18 MED ORDER — POTASSIUM CHLORIDE 10 MEQ/100ML IV SOLN
10.0000 meq | INTRAVENOUS | Status: AC
  Administered 2017-03-19 (×4): 10 meq via INTRAVENOUS
  Filled 2017-03-18 (×4): qty 100

## 2017-03-18 MED ORDER — TRAZODONE HCL 50 MG PO TABS
25.0000 mg | ORAL_TABLET | Freq: Every evening | ORAL | Status: DC | PRN
  Administered 2017-03-18: 25 mg via ORAL
  Filled 2017-03-18: qty 1

## 2017-03-18 MED ORDER — PANTOPRAZOLE SODIUM 40 MG PO TBEC
40.0000 mg | DELAYED_RELEASE_TABLET | Freq: Every day | ORAL | Status: DC
  Administered 2017-03-19 – 2017-03-20 (×2): 40 mg via ORAL
  Filled 2017-03-18 (×2): qty 1

## 2017-03-18 MED ORDER — ADULT MULTIVITAMIN W/MINERALS CH
1.0000 | ORAL_TABLET | Freq: Every day | ORAL | Status: DC
  Administered 2017-03-19 – 2017-03-20 (×2): 1 via ORAL
  Filled 2017-03-18 (×2): qty 1

## 2017-03-18 MED ORDER — DOCUSATE SODIUM 100 MG PO CAPS
100.0000 mg | ORAL_CAPSULE | Freq: Two times a day (BID) | ORAL | Status: DC
  Administered 2017-03-18 – 2017-03-20 (×3): 100 mg via ORAL
  Filled 2017-03-18 (×3): qty 1

## 2017-03-18 MED ORDER — VITAMIN D 1000 UNITS PO TABS
2000.0000 [IU] | ORAL_TABLET | Freq: Every day | ORAL | Status: DC
  Administered 2017-03-18 – 2017-03-20 (×3): 2000 [IU] via ORAL
  Filled 2017-03-18 (×3): qty 2

## 2017-03-18 MED ORDER — POTASSIUM CHLORIDE CRYS ER 20 MEQ PO TBCR: 40.0000 meq | EXTENDED_RELEASE_TABLET | Freq: Once | ORAL | Status: DC

## 2017-03-18 MED ORDER — ACETAMINOPHEN 325 MG PO TABS: 650.0000 mg | ORAL_TABLET | Freq: Four times a day (QID) | ORAL | Status: DC | PRN

## 2017-03-18 MED ORDER — TURMERIC CURCUMIN 500 MG PO CAPS: 500.0000 mg | ORAL_CAPSULE | Freq: Every day | ORAL | Status: DC

## 2017-03-18 MED ORDER — LEVOTHYROXINE SODIUM 88 MCG PO TABS
88.0000 ug | ORAL_TABLET | Freq: Every day | ORAL | Status: DC
  Administered 2017-03-19 – 2017-03-20 (×2): 88 ug via ORAL
  Filled 2017-03-18 (×2): qty 1

## 2017-03-18 MED ORDER — ALBUTEROL SULFATE (2.5 MG/3ML) 0.083% IN NEBU: 3.0000 mL | INHALATION_SOLUTION | Freq: Four times a day (QID) | RESPIRATORY_TRACT | Status: DC | PRN

## 2017-03-18 MED ORDER — CALCIUM CARBONATE-VITAMIN D 500-200 MG-UNIT PO TABS
1.0000 | ORAL_TABLET | Freq: Every day | ORAL | Status: DC
Start: 2017-03-18 — End: 2017-03-20
  Administered 2017-03-18 – 2017-03-20 (×3): 1 via ORAL
  Filled 2017-03-18 (×3): qty 1

## 2017-03-18 MED ORDER — POTASSIUM CHLORIDE IN NACL 20-0.9 MEQ/L-% IV SOLN
INTRAVENOUS | Status: DC
  Administered 2017-03-18: 17:00:00 via INTRAVENOUS
  Filled 2017-03-18 (×2): qty 1000

## 2017-03-18 MED ORDER — ACETAMINOPHEN 650 MG RE SUPP: 650.0000 mg | Freq: Four times a day (QID) | RECTAL | Status: DC | PRN

## 2017-03-18 MED ORDER — ONDANSETRON HCL 4 MG PO TABS: 4.0000 mg | ORAL_TABLET | Freq: Four times a day (QID) | ORAL | Status: DC | PRN

## 2017-03-18 MED ORDER — MOMETASONE FURO-FORMOTEROL FUM 100-5 MCG/ACT IN AERO
2.0000 | INHALATION_SPRAY | Freq: Two times a day (BID) | RESPIRATORY_TRACT | Status: DC
  Administered 2017-03-18 – 2017-03-19 (×2): 2 via RESPIRATORY_TRACT
  Filled 2017-03-18: qty 8.8

## 2017-03-18 MED ORDER — HYDROCODONE-ACETAMINOPHEN 5-325 MG PO TABS: 1.0000 | ORAL_TABLET | ORAL | Status: DC | PRN

## 2017-03-18 MED ORDER — HEPARIN SODIUM (PORCINE) 5000 UNIT/ML IJ SOLN
5000.0000 [IU] | Freq: Three times a day (TID) | INTRAMUSCULAR | Status: DC
  Administered 2017-03-18 – 2017-03-19 (×3): 5000 [IU] via SUBCUTANEOUS
  Filled 2017-03-18 (×3): qty 1

## 2017-03-18 MED ORDER — SIMVASTATIN 20 MG PO TABS
20.0000 mg | ORAL_TABLET | Freq: Every day | ORAL | Status: DC
Start: 2017-03-18 — End: 2017-03-20
  Administered 2017-03-19: 20 mg via ORAL
  Filled 2017-03-18: qty 1

## 2017-03-18 MED ORDER — VITAMIN E 180 MG (400 UNIT) PO CAPS
400.0000 [IU] | ORAL_CAPSULE | Freq: Every day | ORAL | Status: DC
  Administered 2017-03-18 – 2017-03-20 (×3): 400 [IU] via ORAL
  Filled 2017-03-18 (×4): qty 1

## 2017-03-18 MED ORDER — GUAIFENESIN-DM 100-10 MG/5ML PO SYRP
5.0000 mL | ORAL_SOLUTION | ORAL | Status: DC | PRN
  Administered 2017-03-18 – 2017-03-19 (×2): 5 mL via ORAL
  Filled 2017-03-18 (×2): qty 5

## 2017-03-18 NOTE — ED Provider Notes (Signed)
[facility] Emergency Department Provider Note       Time seen: ----------------------------------------- 3:02 PM on 03/18/2017 -----------------------------------------     I have reviewed the triage vital signs and the nursing notes.   HISTORY   Chief Complaint Cough    HPI Cheryl Huff is a 81 y.o. female who presents to the ED for generalized weakness and cough for last 4-5 days. Patient states a nonproductive cough and one episode of vomiting with intermittent nausea. She denies fevers, chills, aches, sore throat or other complaints. Patient has not had this happen before. She's been sick for almost a week without any improvement. She denies a history of this in the past.   Past Medical History:  Diagnosis Date  . Arthritis   . Asthma   . HLD (hyperlipidemia)   . HTN (hypertension)   . Seizures (HCC)   . Stroke [facility])     Patient Active Problem List   Diagnosis Date Noted  . Allergic state 11/26/2016  . Anxiety 11/26/2016  . Arthritis 11/26/2016  . Depression 11/26/2016  . GERD (gastroesophageal reflux disease) 11/26/2016  . Hyperlipidemia, unspecified 11/26/2016  . Hypokalemia 11/26/2016  . Hypothyroidism (acquired) 11/26/2016  . Obstructive lung disease (HCC) 11/26/2016  . Seizure disorder (HCC) 11/26/2016  . BRBPR (bright red blood per rectum) 03/27/2016  . Incontinence of feces 03/27/2016  . Abnormal PFT 03/04/2015  . COPD with asthma (HCC) 12/31/2014  . Dizziness, nonspecific 12/21/2014  . ANA positive 11/16/2014  . Abnormal ECG 11/13/2014  . Primary osteoarthritis involving multiple joints 10/15/2014  . Insomnia 06/18/2014  . Bradycardia 03/19/2014  . Obesity, Class I, BMI 30-34.9 12/25/2013  . Hyperlipidemia, mixed 11/14/2013  . Hypertensive kidney disease with chronic kidney disease stage III 11/14/2013  . Hypothyroid 11/14/2013  . Extrinsic asthma with exacerbation 10/20/2013  . Joint pain 07/05/2013  . Asthma  03/30/2013  . Benign essential hypertension 03/30/2013  . Female stress incontinence 03/30/2013  . 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 ABDOMINAL HYSTERECTOMY     also one ovary taken  . APPENDECTOMY    . HIP SURGERY Right    3 pins put in from fall cracking hip  . suspension of bladder     1961    Allergies Neomycin-bacitracin zn-polymyx  Social History Social History  Substance Use Topics  . Smoking status: Former Smoker    Quit date: 11/02/1965  . Smokeless tobacco: Never Used  . Alcohol use Yes     Comment: occ    Review of Systems Constitutional: Negative for fever. Eyes: Negative for vision changes ENT:  Negative for congestion, sore throat Cardiovascular: Negative for chest pain. Respiratory: Positive for shortness of breath and cough Gastrointestinal: Negative for abdominal pain, vomiting and diarrhea. Genitourinary: Negative for dysuria. Musculoskeletal: Negative for back pain. Skin: Negative for rash. Neurological: Negative for headaches, focal weakness or numbness.  All systems negative/normal/unremarkable except as stated in the HPI  ____________________________________________   PHYSICAL EXAM:  VITAL SIGNS: ED Triage Vitals [03/18/17 1459]  Enc Vitals Group     BP (!) 153/75     Pulse Rate 98     Resp 19     Temp 98.5 F (36.9 C)     Temp Source Oral     SpO2 93 %     Weight 200 lb (90.7 kg)     Height 5\' 6"  (1.676 m)     Head Circumference  Peak Flow      Pain Score      Pain Loc      Pain Edu?      Excl. in GC?     Constitutional: Alert and oriented. Well appearing and in no distress. Eyes: Conjunctivae are normal. PERRL. Normal extraocular movements. ENT   Head: Normocephalic and atraumatic.   Nose: No congestion/rhinnorhea.   Mouth/Throat: Mucous membranes are moist.   Neck: No stridor. Cardiovascular: Normal rate, regular rhythm. No  murmurs, rubs, or gallops. Respiratory: Normal respiratory effort without tachypnea nor retractions. Scattered rhonchi on auscultation Gastrointestinal: Soft and nontender. Normal bowel sounds Musculoskeletal: Nontender with normal range of motion in extremities. No lower extremity tenderness nor edema. Neurologic:  Normal speech and language. No gross focal neurologic deficits are appreciated.  Skin:  Skin is warm, dry and intact. No rash noted. Psychiatric: Mood and affect are normal. Speech and behavior are normal.  ____________________________________________  EKG: Interpreted by me. Sinus rhythm rate of 94 bpm, normal PR interval, normal QRS, normal QT. Left anterior fascicular block  ____________________________________________  ED COURSE:  Pertinent labs & imaging results that were available during my care of the patient were reviewed by me and considered in my medical decision making (see chart for details). Patient presents for shortness of breath and cough, we will assess with labs and imaging as indicated.   Procedures ____________________________________________   LABS (pertinent positives/negatives)  Labs Reviewed  CBC WITH DIFFERENTIAL/PLATELET - Abnormal; Notable for the following:       Result Value   Hemoglobin 11.7 (*)    HCT 33.4 (*)    Neutro Abs 8.4 (*)    Lymphs Abs 0.4 (*)    Monocytes Absolute 1.2 (*)    All other components within normal limits  BASIC METABOLIC PANEL - Abnormal; Notable for the following:    Sodium 120 (*)    Potassium 3.0 (*)    Chloride 82 (*)    Glucose, Bld 110 (*)    Calcium 8.3 (*)    All other components within normal limits  BRAIN NATRIURETIC PEPTIDE - Abnormal; Notable for the following:    B Natriuretic Peptide 268.0 (*)    All other components within normal limits  TROPONIN I    RADIOLOGY Images were viewed by me  Chest x-ray IMPRESSION: Cardiomegaly with mild bilateral pulmonary interstitial prominence and  bilateral small pleural effusions consistent with mild CHF. ____________________________________________  FINAL ASSESSMENT AND PLAN  Shortness of breath, congestive heart failure, hyponatremia  Plan: Patient's labs and imaging were dictated above. Patient had presented for persistent cough with shortness of breath. She did have evidence of congestive heart failure on chest x-ray. Also appears to be very hyponatremic of uncertain etiology. We will discuss with the hospitalist for admission and further evaluation with echo.   Emily FilbertWilliams, Keeley Sussman E, MD   Note: This note was generated in part or whole with voice recognition software. Voice recognition is usually quite accurate but there are transcription errors that can and very often do occur. I apologize for any typographical errors that were not detected and corrected.     Emily FilbertWilliams, [place]aic Welliver E, MD 03/18/17 [phone]

## 2017-03-18 NOTE — ED Notes (Signed)
Patient transported to X-ray 

## 2017-03-18 NOTE — H&P (Signed)
[facility] - [place] [facility]   PATIENT NAME: Cheryl Huff    MR#:  [id number]  DATE OF BIRTH:  [birth year]  DATE OF ADMISSION:  03/18/2017  PRIMARY CARE PHYSICIAN: Cheryl Hawks, MD   REQUESTING/REFERRING PHYSICIAN: Daryel Huff.  CHIEF COMPLAINT; cough    Chief Complaint  Patient presents with  . Cough    HISTORY OF PRESENT ILLNESS:  Cheryl Huff  is a 81 y.o. female with a known history of COPD, essential hypertension seizure disorder came in because of cough, generalized weakness for the past 5 days. Patient has poor by mouth intake for the past 5 days associated with generalized weakness. Found to have sodium of 120, potassium of 3 with normal labs a week ago with PCP. Patient denies shortness of breath, orthopnea, PND, pedal edema. Patient has some nausea, poor appetite. Denies diarrhea. Patient BNP is slightly elevated to 68. Last echocardiogram 2 years ago with normal  EF.  PAST MEDICAL HISTORY:   Past Medical History:  Diagnosis Date  . Arthritis   . Asthma   . HLD (hyperlipidemia)   . HTN (hypertension)   . Seizures (HCC)   . Stroke [facility])     PAST SURGICAL HISTOIRY:   Past Surgical History:  Procedure Laterality Date  . ABDOMINAL HYSTERECTOMY     also one ovary taken  . APPENDECTOMY    . HIP SURGERY Right    3 pins put in from fall cracking hip  . suspension of bladder     1961    SOCIAL HISTORY:   Social History  Substance Use Topics  . Smoking status: Former Smoker    Quit date: 11/02/1965  . Smokeless tobacco: Never Used  . Alcohol use Yes     Comment: 3x per week    FAMILY HISTORY:   Family History  Problem Relation Age of Onset  . Pancreatic cancer Mother   . Breast cancer Neg Hx   . Prostate cancer Neg Hx   . Bladder Cancer Neg Hx   . Kidney cancer Neg Hx     DRUG ALLERGIES:   Allergies  Allergen Reactions  . Neomycin-Bacitracin Zn-Polymyx Hives    REVIEW OF SYSTEMS:  CONSTITUTIONAL: No  fever,Has fatigue, generalized weakness  EYES: No blurred or double vision.  EARS, NOSE, AND THROAT: No tinnitus or ear pain.  RESPIRATORY:Patient has  coughand occasional phlegm.,no shortness of breath, wheezing or hemoptysis.  CARDIOVASCULAR: No chest pain, orthopnea, edema.  GASTROINTESTINAL: has nausea but no vomiting, does have poor appetite.  Does have poor appetite  G.ENITOURINARY: No dysuria, hematuria.  ENDOCRINE: No polyuria, nocturia,  HEMATOLOGY: No anemia, easy bruising or bleeding SKIN: No rash or lesion. MUSCULOSKELETAL: No joint pain or arthritis.   NEUROLOGIC: No tingling, numbness, weakness.  PSYCHIATRY: No anxiety or depression.   MEDICATIONS AT HOME:   Prior to Admission medications   Medication Sig Start Date End Date Taking? Authorizing Provider  acetaminophen (TYLENOL) 500 MG tablet Take 1,000 mg by mouth every 8 (eight) hours as needed for mild pain or moderate pain.    Yes [provider]  albuterol (PROVENTIL HFA;VENTOLIN HFA) 108 (90 Base) MCG/ACT inhaler Inhale 2 puffs into the lungs every 6 (six) hours as needed for wheezing.  10/20/13  Yes [provider]  aspirin EC 81 MG tablet Take 81 mg by mouth.   Yes [provider]  Calcium Carbonate-Vitamin D3 600-400 MG-UNIT TABS Take 1 tablet by mouth daily.    Yes [provider]  Cholecalciferol (D 2000) 2000 units TABS Take 2,000 Units by mouth daily.    Yes [provider]  citalopram (CELEXA) 10 MG tablet Take 10 mg by mouth. 06/18/14  Yes [provider]  Coenzyme Q10 200 MG capsule Take 200 mg by mouth daily.    Yes [provider]  Fluticasone-Salmeterol (ADVAIR DISKUS) 100-50 MCG/DOSE AEPB Inhale 1 puff into the lungs 2 (two) times daily.  10/20/13  Yes [provider]  Garlic 1000 MG CAPS Take 2,000 mg by mouth daily.    Yes [provider]  Glucosamine-Chondroitin 500-400 MG CAPS Take 1 capsule by mouth 3 (three) times daily.     Yes [provider]  levETIRAcetam (KEPPRA) 500 MG tablet Take 500 mg by mouth 2 (two) times daily.  08/04/16  Yes [provider]  levothyroxine (SYNTHROID, LEVOTHROID) 88 MCG tablet Take 88 mcg by mouth daily before breakfast.  08/04/16  Yes [provider]  losartan-hydrochlorothiazide (HYZAAR) 100-25 MG tablet Take 1 tablet by mouth daily.  08/04/16  Yes [provider]  Multiple Vitamin (MULTIVITAMIN) tablet Take 1 tablet by mouth daily.   Yes [provider]  naproxen sodium (ANAPROX) 220 MG tablet Take 220 mg by mouth daily.   Yes [provider]  omeprazole (PRILOSEC) 40 MG capsule Take 40 mg by mouth daily.  08/04/16  Yes [provider]  Probiotic Product (ALIGN) 4 MG CAPS Take 4 mg by mouth daily.    Yes [provider]  simvastatin (ZOCOR) 20 MG tablet Take 20 mg by mouth. 06/18/14  Yes [provider]  sodium chloride (OCEAN) 0.65 % nasal spray Place into the nose.   Yes [provider]  Turmeric Curcumin 500 MG CAPS Take 500 mg by mouth daily.    Yes [provider]  vitamin E 400 UNIT capsule Take 400 Units by mouth daily.    Yes [provider]      VITAL SIGNS:  Blood pressure (!) 153/75, pulse 98, temperature 98.5 F (36.9 C), temperature source Oral, resp. rate 19, height 5\' 6"  (1.676 m), weight 90.7 kg (200 lb), SpO2 93 %.  PHYSICAL EXAMINATION:  GENERAL:  81 y.o.-year-old patient lying in the bed with no acute distress.  EYES: Pupils equal, round, reactive to light and accommodation. No scleral icterus. Extraocular muscles intact.  HEENT: Head atraumatic, normocephalic. Oropharynx and nasopharynx clear.  NECK:  Supple, no jugular venous distention. No thyroid enlargement, no tenderness.  LUNGS:  has faint expiratory wheeze on the left side.  norales,rhonchi or crepitation. No use of accessory muscles of respiration.  CARDIOVASCULAR: S1, S2 normal. No murmurs, rubs,  or gallops.  ABDOMEN: Soft, nontender, nondistended. Bowel sounds present. No organomegaly or mass.  EXTREMITIES: No pedal edema, cyanosis, or clubbing.  NEUROLOGIC: Cranial nerves II through XII are intact. Muscle strength 5/5 in all extremities. Sensation intact. Gait not checked.  PSYCHIATRIC: The patient is alert and oriented x 3.  SKIN: No obvious rash, lesion, or ulcer.   LABORATORY PANEL:   CBC  Recent Labs Lab 03/18/17 1520  WBC 10.4  HGB 11.7*  HCT 33.4*  PLT 225   ------------------------------------------------------------------------------------------------------------------  Chemistries   Recent Labs Lab 03/18/17 1520  NA 120*  K 3.0*  CL 82*  CO2 25  GLUCOSE 110*  BUN 14  CREATININE 0.73  CALCIUM 8.3*   ------------------------------------------------------------------------------------------------------------------  Cardiac Enzymes  Recent Labs Lab 03/18/17 1520  TROPONINI <0.03   ------------------------------------------------------------------------------------------------------------------  RADIOLOGY:  Dg  Chest 2 View  Result Date: 03/18/2017 CLINICAL DATA:  Shortness breath.  Productive cough. EXAM: CHEST  2 VIEW COMPARISON:  No recent prior . FINDINGS: Mediastinum hilar structures normal. Cardiomegaly with mild bilateral interstitial prominence and small pleural effusions suggesting mild CHF. Pneumonitis cannot be excluded. No pneumothorax. IMPRESSION: Cardiomegaly with mild bilateral pulmonary interstitial prominence and bilateral small pleural effusions consistent with mild CHF. Electronically Signed   By: Maisie Fus  Register   On: 03/18/2017 15:48    EKG:   Orders placed or performed during the hospital encounter of 03/18/17  . ED EKG  . ED EKG  . EKG 12-Lead  . EKG 12-Lead  . ED EKG  . ED EKG  EKG shows normal sinus rhythm at 94 bpm, no ST T changes.   IMPRESSION AND PLAN:   81 year old female patient with intractable cough,  generalized weakness, poor by mouth intake for the past 5 days. Found to have hyponatremia, hypokalemia.  #1 acute hyponatremia, hypokalemia likely secondary to poor by mouth intake and dehydration: Patient had normal sodium and potassium a week ago by PCP labs. Her gentle hydration with normal saline and potassium and check Chem-7 every 6 hours. Monitor on telemetry. Hold hydrochlorothiazide at this time.  #2 mild congestive heart failure by x-ray but patient clinically looks dry: Avoid giving IV Lasix at this time but check echocardiogram.  #3 history of COPD and has mild wheezing. Continue albuterol, Advair.  #4 GERD continue PPIs next #  5. history of seizure disorder: Continue Keppra 500 MG by mouth twice a day.   6 overactive bladder: Continue Myrbetreq 25 mg by mouth daily  #7. hyperlipidemia: Continue statins.    All the records are reviewed and case discussed with ED provider. Management plans discussed with the patient, family and they are in agreement.  CODE STATUS: full  TOTAL TIME TAKING CARE OF THIS PATIENT: .    Katha Hamming M.D on 03/18/2017 at 5:14 PM  Between 7am to 6pm - Pager - [phone]  After 6pm go to [email or web address] - password [email or web address] [facility]  [facility]  Office  [phone]  CC: Primary care physician; Shroff, Clancy Gourd, MD  Note: This dictation was prepared with Dragon dictation along with smaller phrase technology. Any transcriptional errors that result from this process are unintentional.

## 2017-03-18 NOTE — Progress Notes (Signed)
PHARMACIST - PHYSICIAN ORDER COMMUNICATION  CONCERNING: P&T Medication Policy on Herbal Medications  DESCRIPTION:  This patient's orders for: GLUCOSAMINE-CHONDROITIN 500-400 MG PO CAPS, Turmeric Curcumin Caps, Garlic Caps, Coenzyme Q10,  have been noted.  These product(s) are classified as "herbal" or natural product. Due to a lack of definitive safety studies or FDA approval, nonstandard manufacturing practices, plus the potential risk of unknown drug-drug interactions while on inpatient medications, the Pharmacy and Therapeutics Committee does not permit the use of "herbal" or natural products of this type [facility].   ACTION TAKEN: The pharmacy department is unable to verify this order at this time and your patient has been informed of this safety policy. Please reevaluate patient's clinical condition at discharge and address if the herbal or natural product(s) should be resumed at that time.  Gardner CandleSheema M Analena Gama, PharmD, BCPS Clinical Pharmacist 03/18/2017 5:36 PM

## 2017-03-18 NOTE — ED Triage Notes (Signed)
Pt to ED via EMS from home c/o generalized weakness and cough x4-5 days.  States non productive cough, vomited once last night with intermittent nausea.  EMS vitals 114 CBG, 92% RA, 98.3 temp.

## 2017-03-19 ENCOUNTER — Inpatient Hospital Stay
Admit: 2017-03-19 | Discharge: 2017-03-19 | Disposition: A | Discharge status: Home / Self Care | Payer: Medicare Other | Attending: Internal Medicine | Admitting: Internal Medicine

## 2017-03-19 LAB — CBC
HCT: 30.1 % — ABNORMAL LOW (ref 35.0–47.0)
Hemoglobin: 10.8 g/dL — ABNORMAL LOW (ref 12.0–16.0)
MCH: 31 pg (ref 26.0–34.0)
MCHC: 36 g/dL (ref 32.0–36.0)
MCV: 86.3 fL (ref 80.0–100.0)
PLATELETS: 197 10*3/uL (ref 150–440)
RBC: 3.49 MIL/uL — ABNORMAL LOW (ref 3.80–5.20)
RDW: 13.1 % (ref 11.5–14.5)
WBC: 7.4 10*3/uL (ref 3.6–11.0)

## 2017-03-19 LAB — BASIC METABOLIC PANEL
ANION GAP: 7 (ref 5–15)
ANION GAP: 8 (ref 5–15)
ANION GAP: 8 (ref 5–15)
Anion gap: 5 (ref 5–15)
BUN: 9 mg/dL (ref 6–20)
BUN: 8 mg/dL (ref 6–20)
BUN: 10 mg/dL (ref 6–20)
BUN: 9 mg/dL (ref 6–20)
CALCIUM: 7.9 mg/dL — AB (ref 8.9–10.3)
CALCIUM: 8.7 mg/dL — AB (ref 8.9–10.3)
CHLORIDE: 93 mmol/L — AB (ref 101–111)
CHLORIDE: 96 mmol/L — AB (ref 101–111)
CO2: 29 mmol/L (ref 22–32)
CO2: 29 mmol/L (ref 22–32)
CO2: 29 mmol/L (ref 22–32)
CO2: 29 mmol/L (ref 22–32)
CREATININE: 0.71 mg/dL (ref 0.44–1.00)
Calcium: 9 mg/dL (ref 8.9–10.3)
Calcium: 8.8 mg/dL — ABNORMAL LOW (ref 8.9–10.3)
Chloride: 90 mmol/L — ABNORMAL LOW (ref 101–111)
Chloride: 92 mmol/L — ABNORMAL LOW (ref 101–111)
Creatinine, Ser: 1.06 mg/dL — ABNORMAL HIGH (ref 0.44–1.00)
Creatinine, Ser: 0.81 mg/dL (ref 0.44–1.00)
Creatinine, Ser: 0.89 mg/dL (ref 0.44–1.00)
GFR calc Af Amer: >60 mL/min (ref >60)
GFR calc Af Amer: 57 mL/min — ABNORMAL LOW (ref >60)
GFR calc Af Amer: >60 mL/min (ref >60)
GFR calc non Af Amer: >60 mL/min (ref >60)
GFR calc non Af Amer: 49 mL/min — ABNORMAL LOW (ref >60)
GFR calc non Af Amer: >60 mL/min (ref >60)
GFR calc non Af Amer: >60 mL/min (ref >60)
GLUCOSE: 92 mg/dL (ref 65–99)
GLUCOSE: 120 mg/dL — AB (ref 65–99)
GLUCOSE: 121 mg/dL — AB (ref 65–99)
Glucose, Bld: 122 mg/dL — ABNORMAL HIGH (ref 65–99)
POTASSIUM: 4.1 mmol/L (ref 3.5–5.1)
POTASSIUM: 4.5 mmol/L (ref 3.5–5.1)
Potassium: 6 mmol/L — ABNORMAL HIGH (ref 3.5–5.1)
Potassium: 4.6 mmol/L (ref 3.5–5.1)
Sodium: 124 mmol/L — ABNORMAL LOW (ref 135–145)
Sodium: 128 mmol/L — ABNORMAL LOW (ref 135–145)
Sodium: 130 mmol/L — ABNORMAL LOW (ref 135–145)
Sodium: 133 mmol/L — ABNORMAL LOW (ref 135–145)

## 2017-03-19 LAB — MAGNESIUM: Magnesium: 1.5 mg/dL — ABNORMAL LOW (ref 1.7–2.4)

## 2017-03-19 LAB — GLUCOSE, CAPILLARY: Glucose-Capillary: 85 mg/dL (ref 65–99)

## 2017-03-19 MED ORDER — BUDESONIDE 0.5 MG/2ML IN SUSP
0.5000 mg | Freq: Two times a day (BID) | RESPIRATORY_TRACT | Status: DC
  Administered 2017-03-19 – 2017-03-20 (×3): 0.5 mg via RESPIRATORY_TRACT
  Filled 2017-03-19 (×3): qty 2

## 2017-03-19 MED ORDER — POTASSIUM CHLORIDE CRYS ER 20 MEQ PO TBCR
40.0000 meq | EXTENDED_RELEASE_TABLET | ORAL | Status: AC
  Administered 2017-03-19 (×2): 40 meq via ORAL
  Filled 2017-03-19 (×2): qty 2

## 2017-03-19 MED ORDER — ASPIRIN EC 81 MG PO TBEC
81.0000 mg | DELAYED_RELEASE_TABLET | Freq: Every day | ORAL | Status: DC
Start: 2017-03-20 — End: 2017-03-20
  Administered 2017-03-20: 81 mg via ORAL
  Filled 2017-03-19: qty 1

## 2017-03-19 MED ORDER — ENOXAPARIN SODIUM 40 MG/0.4ML ~~LOC~~ SOLN
40.0000 mg | SUBCUTANEOUS | Status: DC
  Administered 2017-03-19: 40 mg via SUBCUTANEOUS
  Filled 2017-03-19: qty 0.4

## 2017-03-19 MED ORDER — IPRATROPIUM-ALBUTEROL 0.5-2.5 (3) MG/3ML IN SOLN
3.0000 mL | Freq: Four times a day (QID) | RESPIRATORY_TRACT | Status: DC
  Administered 2017-03-19 – 2017-03-20 (×4): 3 mL via RESPIRATORY_TRACT
  Filled 2017-03-19 (×4): qty 3

## 2017-03-19 MED ORDER — SODIUM CHLORIDE 0.9 % IV SOLN
INTRAVENOUS | Status: DC
  Administered 2017-03-19: 08:00:00 via INTRAVENOUS

## 2017-03-19 MED ORDER — FLUTICASONE PROPIONATE 50 MCG/ACT NA SUSP
1.0000 | Freq: Every day | NASAL | Status: DC
  Administered 2017-03-19 – 2017-03-20 (×2): 1 via NASAL
  Filled 2017-03-19: qty 16

## 2017-03-19 NOTE — Progress Notes (Signed)
Reported 8 beat run of trigeminal PVC's to Dr. Sheryle Hailiamond. Doctor to placed orders.

## 2017-03-19 NOTE — Evaluation (Signed)
Physical Therapy Evaluation Patient Details Name: Cheryl Huff MRN: [id number] DOB: [birth year] Today's Date: 03/19/2017   History of Present Illness  81 y.o. female with a known history of COPD, essential hypertension seizure disorder came in because of cough, generalized weakness for the past 5 days. Patient has poor by mouth intake for the past 5 days associated with generalized weakness.  Pt admitted with hyponatremia, sodium was 120 on arrival, it is improving.  Pt reports that she still feels a little weak, but ultimately is feeling better.  Clinical Impression  Pt did well with PT exam showing good strength, balance, ambulation and general safety.  She had no issues with 200 ft w/o AD and reports feeling close to her baseline.  She states she exercises regularly at least 1 day/week but wants to start doing more.  She states she will do this on her own and does not feel that she would benefit from HHPT.  Pt is safe to return home when medically stable.     Follow Up Recommendations No PT follow up    Equipment Recommendations       Recommendations for Other Services       Precautions / Restrictions Precautions Precautions: Fall Restrictions Weight Bearing Restrictions: No      Mobility  Bed Mobility Overal bed mobility: Independent             General bed mobility comments: Pt able to rise w/o hesitation, good confidence  Transfers Overall transfer level: Independent Equipment used: None             General transfer comment: Pt stands with good confidence and balance, no issues.  Ambulation/Gait Ambulation/Gait assistance: Modified independent (Device/Increase time) Ambulation Distance (Feet): 200 Feet Assistive device: None       General Gait Details: Pt did well with ambulation, had consistent speed and good confidence.  Pt had only very minimal fatigue and good safety.  Stairs            Wheelchair Mobility    Modified Rankin (Stroke  Patients Only)       Balance Overall balance assessment: Independent                                           Pertinent Vitals/Pain Pain Assessment: No/denies pain    Home Living Family/patient expects to be discharged to:: Private residence Living Arrangements: Alone     Home Access: Level entry       Home Equipment: Cane - single point (does not use)      Prior Function Level of Independence: Independent         Comments: Pt states she can do all she needs, reports she is not as active as she thinks she should be     Hand Dominance        Extremity/Trunk Assessment   Upper Extremity Assessment Upper Extremity Assessment: Overall WFL for tasks assessed    Lower Extremity Assessment Lower Extremity Assessment: Overall WFL for tasks assessed       Communication   Communication: No difficulties  Cognition Arousal/Alertness: Awake/alert Behavior During Therapy: WFL for tasks assessed/performed Overall Cognitive Status: Within Functional Limits for tasks assessed  General Comments      Exercises     Assessment/Plan    PT Assessment Patent does not need any further PT services  PT Problem List         PT Treatment Interventions      PT Goals (Current goals can be found in the Care Plan section)  Acute Rehab PT Goals Patient Stated Goal: go home PT Goal Formulation: All assessment and education complete, DC therapy    Frequency     Barriers to discharge        Co-evaluation               AM-PAC PT "6 Clicks" Daily Activity  Outcome Measure Difficulty turning over in bed (including adjusting bedclothes, sheets and blankets)?: None Difficulty moving from lying on back to sitting on the side of the bed? : None Difficulty sitting down on and standing up from a chair with arms (e.g., wheelchair, bedside commode, etc,.)?: None Help needed moving to and from a bed  to chair (including a wheelchair)?: None Help needed walking in hospital room?: None Help needed climbing 3-5 steps with a railing? : None 6 Click Score: 24    End of Session Equipment Utilized During Treatment: Gait belt Activity Tolerance: Patient tolerated treatment well Patient left: with bed alarm set;with call bell/phone within reach   PT Visit Diagnosis: Difficulty in walking, not elsewhere classified (R26.2)    Time: 0347-4259 PT Time Calculation (min) (ACUTE ONLY): 26 min   Charges:   PT Evaluation $PT Eval Low Complexity: 1 Procedure     PT G CodesMalachi Pro, DPT 03/19/2017, 4:16 PM

## 2017-03-19 NOTE — Progress Notes (Signed)
Patient ID: Cheryl Huff, female   DOB: [birth year], 81 y.o.   MRN: [id number]  [facility] PROGRESS NOTE  Cheryl SachsGeneva H Littlefield HYQ:[id number] DOB: [birth year] DOA: 03/18/2017 PCP: Jordan HawksShroff, Rushad Darius, MD  HPI/Subjective: Patient with a lot of cough and congestion. A lot of mucus. Some shortness of breath. Patient with some confusion. Admitted with low sodium. She hasn't been eating or drinking very well she's been vomiting.  Objective: Vitals:   03/19/17 0443 03/19/17 1319  BP: (!) 153/74 (!) 146/73  Pulse: 69 77  Resp: 17   Temp: 98.8 F (37.1 C) 98.3 F (36.8 C)    Filed Weights   03/18/17 1459 03/19/17 0500  Weight: 90.7 kg (200 lb) 93 kg (205 lb)    ROS: Review of Systems  Constitutional: Negative for chills and fever.  Eyes: Negative for blurred vision.  Respiratory: Positive for cough and shortness of breath.   Cardiovascular: Negative for chest pain.  Gastrointestinal: Positive for nausea. Negative for constipation, diarrhea and vomiting.  Genitourinary: Negative for dysuria.  Musculoskeletal: Negative for joint pain.  Neurological: Negative for dizziness and headaches.   Exam: Physical Exam  Constitutional: She is oriented to person, place, and time.  HENT:  Nose: No mucosal edema.  Mouth/Throat: No oropharyngeal exudate or posterior oropharyngeal edema.  Eyes: Conjunctivae, EOM and lids are normal. Pupils are equal, round, and reactive to light.  Neck: No JVD present. Carotid bruit is not present. No edema present. No thyroid mass and no thyromegaly present.  Cardiovascular: S1 normal and S2 normal.  Exam reveals no gallop.   Murmur heard.  Systolic murmur is present with a grade of 2/6  Pulses:      Dorsalis pedis pulses are 2+ on the right side, and 2+ on the left side.  Respiratory: No respiratory distress. She has decreased breath sounds in the right middle field, the right lower field, the left middle field and the left lower field. She has no wheezes.  She has no rhonchi. She has no rales.  GI: Soft. Bowel sounds are normal. There is no tenderness.  Musculoskeletal:       Right ankle: She exhibits no swelling.       Left ankle: She exhibits no swelling.  Lymphadenopathy:    She has no cervical adenopathy.  Neurological: She is alert and oriented to person, place, and time. No cranial nerve deficit.  Skin: Skin is warm. No rash noted. Nails show no clubbing.  Psychiatric: She has a normal mood and affect.      Data Reviewed: Basic Metabolic Panel:  Recent Labs Lab 03/18/17 1520 03/18/17 2255 03/19/17 0553 03/19/17 1104  NA 120* 123* 124* 128*  K 3.0* 2.7* 6.0* 4.6  CL 82* 83* 90* 92*  CO2 25 30 29 29   GLUCOSE 110* 163* 92 120*  BUN 14 12 9 8   CREATININE 0.73 0.91 0.71 1.06*  CALCIUM 8.3* 8.4* 7.9* 8.7*  MG  --   --  1.5*  --    CBC:  Recent Labs Lab 03/18/17 1520 03/19/17 0553  WBC 10.4 7.4  NEUTROABS 8.4*  --   HGB 11.7* 10.8*  HCT 33.4* 30.1*  MCV 85.8 86.3  PLT 225 197   Cardiac Enzymes:  Recent Labs Lab 03/18/17 1520  TROPONINI <0.03   BNP (last 3 results)  Recent Labs  03/18/17 1520  BNP 268.0*     CBG:  Recent Labs Lab 03/19/17 0735  GLUCAP 85     Studies: Dg Chest 2  View  Result Date: 03/18/2017 CLINICAL DATA:  Shortness breath.  Productive cough. EXAM: CHEST  2 VIEW COMPARISON:  No recent prior . FINDINGS: Mediastinum hilar structures normal. Cardiomegaly with mild bilateral interstitial prominence and small pleural effusions suggesting mild CHF. Pneumonitis cannot be excluded. No pneumothorax. IMPRESSION: Cardiomegaly with mild bilateral pulmonary interstitial prominence and bilateral small pleural effusions consistent with mild CHF. Electronically Signed   By: Maisie Fus  Register   On: 03/18/2017 15:48    Scheduled Meds: . acidophilus  1 capsule Oral Daily  . budesonide (PULMICORT) nebulizer solution  0.5 mg Nebulization BID  . calcium-vitamin D  1 tablet Oral Daily  .  cholecalciferol  2,000 Units Oral Daily  . citalopram  10 mg Oral QPM  . docusate sodium  100 mg Oral BID  . enoxaparin (LOVENOX) injection  40 mg Subcutaneous Q24H  . fluticasone  1 spray Each Nare Daily  . ipratropium-albuterol  3 mL Nebulization Q6H  . levETIRAcetam  500 mg Oral BID  . levothyroxine  88 mcg Oral QAC breakfast  . multivitamin with minerals  1 tablet Oral Daily  . naproxen  250 mg Oral Daily  . pantoprazole  40 mg Oral Daily  . simvastatin  20 mg Oral q1800  . vitamin E  400 Units Oral Daily   Continuous Infusions: . sodium chloride 50 mL/hr at 03/19/17 0738    Assessment/Plan:  1. Severe hyponatremia. This morning I decreased the rate of the IV fluids. I will decrease the rate of the IV fluids again this afternoon. Sodium came up from 120 up to 128. Likely combination of not eating, nausea vomiting and also taking hydrochlorothiazide. Hydrochlorothiazide is not a good medication for this patient. 2. Hypokalemia. This has been replaced. Stop oral and IV replacement at this point. 3. Cough and congestion. Decrease rate of IV fluids. Check an echocardiogram. Start nebulizer treatments. Continue to monitor closely. Holding losartan. History of asthma. 4. History of seizure on Keppra 5. Hypothyroidism unspecified on levothyroxine 6. History of stroke. Restart aspirin 7. Depression on Celexa 8. GERD on PPI  Code Status:     Code Status Orders        Start     Ordered   03/18/17 1654  Full code  Continuous     03/18/17 1654    Code Status History    Date Active Date Inactive Code Status Order ID Comments User Context   This patient has a current code status but no historical code status.    Advance Directive Documentation     Most Recent Value  Type of Advance Directive  Living will  Pre-existing out of facility DNR order (yellow form or pink MOST form)  -  "MOST" Form in Place?  -     Family Communication: Spoke with son on the phone Disposition Plan:  To be determined based on laboratory data and strength.  Consultants:  Physical therapy  Time spent: 28 minutes  Alford Highland  [employer]

## 2017-03-20 LAB — GLUCOSE, CAPILLARY: Glucose-Capillary: 105 mg/dL — ABNORMAL HIGH (ref 65–99)

## 2017-03-20 LAB — MAGNESIUM: Magnesium: 1.7 mg/dL (ref 1.7–2.4)

## 2017-03-20 MED ORDER — AMLODIPINE BESYLATE 2.5 MG PO TABS: 2.5000 mg | ORAL_TABLET | Freq: Every day | ORAL | 2 refills | Status: AC

## 2017-03-20 MED ORDER — BENZONATATE 100 MG PO CAPS: 100.0000 mg | ORAL_CAPSULE | Freq: Three times a day (TID) | ORAL | 0 refills | Status: AC

## 2017-03-20 MED ORDER — AMLODIPINE BESYLATE 2.5 MG PO TABS: 2.5000 mg | ORAL_TABLET | Freq: Every day | ORAL | 2 refills | Status: DC

## 2017-03-20 MED ORDER — BENZONATATE 100 MG PO CAPS: 100.0000 mg | ORAL_CAPSULE | Freq: Three times a day (TID) | ORAL | 0 refills | Status: DC

## 2017-03-20 NOTE — Progress Notes (Signed)
Pt d/c home; d/c instructions reviewed w/ pt; pt understanding was verbalized; IV removed, catheter in tact, gauze dressing applied; all pt questions answered; pt verbalized that all pt belongings were accounted for; pt left unit via wheelchair accompanied by staff 

## 2017-03-20 NOTE — Progress Notes (Signed)
Pnt  Has had an uneventful evening. No issues or concerns at this time. Pnt denies any SOB or pain. Bed low, locked and call bell in reach. Will continue to monitor and assess.

## 2017-03-20 NOTE — Discharge Instructions (Signed)
Follow all MD discharge instructions. Take all medications as prescribed. Keep all follow up appointments. If your symptoms return, call your doctor. If you experience any new symptoms that are of concern to you or that are bothersome to you, call your doctor. For all questions and/or concerns, call your doctor. ° ° If you have a medical emergency, call 911 ° ° ° °

## 2017-03-21 NOTE — Discharge Summary (Signed)
[facility] - [place] [facility]   PATIENT NAME: Cheryl Huff    MR#:  [id number]  DATE OF BIRTH:  September 05, 1936  DATE OF ADMISSION:  03/18/2017   ADMITTING PHYSICIAN: Katha Hamming, MD  DATE OF DISCHARGE: 03/20/2017  3:01 PM  PRIMARY CARE PHYSICIAN: Jordan Hawks, MD   ADMISSION DIAGNOSIS:   Shortness of breath [R06.02] Hyponatremia [E87.1] Acute systolic congestive heart failure (HCC) [I50.21]  DISCHARGE DIAGNOSIS:   Active Problems:   Hyponatremia   SECONDARY DIAGNOSIS:   Past Medical History:  Diagnosis Date  . Arthritis   . Asthma   . HLD (hyperlipidemia)   . HTN (hypertension)   . Seizures (HCC)   . Stroke [facility])     HOSPITAL COURSE:   81 year old female with COPD, hypertension, history of seizure disorder presents to hospital secondary to weakness and noted to have hyponatremia.  #1 hyponatremia-secondary to hypovolemia. Sodium on admission was 120, improved with IV fluids to 133 at discharge. -Her weakness has much improved.  #2 dyspnea-secondary to cold and sore throat and recent bronchitis. -No indication for antibiotics. Cough meds added. -Lungs were clear, echocardiogram was done and can be followed up as outpatient. -Continue outpatient inhalers  #3 depression-continue home medications.  #4 hypertension-started on Norvasc. Losartan hydrochlorothiazide has been discontinued at discharge.  #5 seizure disorder-stable. On Keppra  Feels better and will be discharged today. Physical therapy with no recommendations  DISCHARGE CONDITIONS:   Guarded  CONSULTS OBTAINED:   None  DRUG ALLERGIES:   Allergies  Allergen Reactions  . Neomycin-Bacitracin Zn-Polymyx Hives   DISCHARGE MEDICATIONS:   Allergies as of 03/20/2017      Reactions   Neomycin-bacitracin Zn-polymyx Hives      Medication List    STOP taking these medications   losartan-hydrochlorothiazide 100-25 MG tablet Commonly known as:  HYZAAR     TAKE these medications   acetaminophen 500 MG tablet Commonly known as:  TYLENOL Take 1,000 mg by mouth every 8 (eight) hours as needed for mild pain or moderate pain.   ADVAIR DISKUS 100-50 MCG/DOSE Aepb Generic drug:  Fluticasone-Salmeterol Inhale 1 puff into the lungs 2 (two) times daily.   albuterol 108 (90 Base) MCG/ACT inhaler Commonly known as:  PROVENTIL HFA;VENTOLIN HFA Inhale 2 puffs into the lungs every 6 (six) hours as needed for wheezing.   ALIGN 4 MG Caps Take 4 mg by mouth daily.   amLODipine 2.5 MG tablet Commonly known as:  NORVASC Take 1 tablet (2.5 mg total) by mouth daily.   aspirin EC 81 MG tablet Take 81 mg by mouth.   benzonatate 100 MG capsule Commonly known as:  TESSALON PERLES Take 1 capsule (100 mg total) by mouth 3 (three) times daily.   Calcium Carbonate-Vitamin D3 600-400 MG-UNIT Tabs Take 1 tablet by mouth daily.   citalopram 10 MG tablet Commonly known as:  CELEXA Take 10 mg by mouth.   Coenzyme Q10 200 MG capsule Take 200 mg by mouth daily.   D 2000 2000 units Tabs Generic drug:  Cholecalciferol Take 2,000 Units by mouth daily.   Garlic 1000 MG Caps Take 2,000 mg by mouth daily.   Glucosamine-Chondroitin 500-400 MG Caps Take 1 capsule by mouth 3 (three) times daily.   levETIRAcetam 500 MG tablet Commonly known as:  KEPPRA Take 500 mg by mouth 2 (two) times daily.   levothyroxine 88 MCG tablet Commonly known as:  SYNTHROID, LEVOTHROID Take 88 mcg by mouth daily before breakfast.   multivitamin  tablet Take 1 tablet by mouth daily.   naproxen sodium 220 MG tablet Commonly known as:  ANAPROX Take 220 mg by mouth daily.   omeprazole 40 MG capsule Commonly known as:  PRILOSEC Take 40 mg by mouth daily.   simvastatin 20 MG tablet Commonly known as:  ZOCOR Take 20 mg by mouth.   sodium chloride 0.65 % nasal spray Commonly known as:  OCEAN Place into the nose.   Turmeric Curcumin 500 MG Caps Take 500 mg by mouth  daily.   vitamin E 400 UNIT capsule Take 400 Units by mouth daily.        DISCHARGE INSTRUCTIONS:   1. PCP follow-up in 1-2 weeks  DIET:   Cardiac diet  ACTIVITY:   Activity as tolerated  OXYGEN:   Home Oxygen: No.  Oxygen Delivery: room air  DISCHARGE LOCATION:   home   If you experience worsening of your admission symptoms, develop shortness of breath, life threatening emergency, suicidal or homicidal thoughts you must seek medical attention immediately by calling 911 or calling your MD immediately  if symptoms less severe.  You Must read complete instructions/literature along with all the possible adverse reactions/side effects for all the Medicines you take and that have been prescribed to you. Take any new Medicines after you have completely understood and accpet all the possible adverse reactions/side effects.   Please note  You were cared for by a hospitalist during your hospital stay. If you have any questions about your discharge medications or the care you received while you were in the hospital after you are discharged, you can call the unit and asked to speak with the hospitalist on call if the hospitalist that took care of you is not available. Once you are discharged, your primary care physician will handle any further medical issues. Please note that NO REFILLS for any discharge medications will be authorized once you are discharged, as it is imperative that you return to your primary care physician (or establish a relationship with a primary care physician if you do not have one) for your aftercare needs so that they can reassess your need for medications and monitor your lab values.    On the day of Discharge:  VITAL SIGNS:   Blood pressure (!) 139/53, pulse 69, temperature 98.1 F (36.7 C), temperature source Oral, resp. rate 18, height 5\' 6"  (1.676 m), weight 92.2 kg (203 lb 3.2 oz), SpO2 96 %.  PHYSICAL EXAMINATION:    GENERAL:  81 y.o.-year-old  patient lying in the bed with no acute distress.  EYES: Pupils equal, round, reactive to light and accommodation. No scleral icterus. Extraocular muscles intact.  HEENT: Head atraumatic, normocephalic. Oropharynx and nasopharynx clear.  NECK:  Supple, no jugular venous distention. No thyroid enlargement, no tenderness.  LUNGS: Normal breath sounds bilaterally, no wheezing, rales,rhonchi or crepitation. No use of accessory muscles of respiration.  CARDIOVASCULAR: S1, S2 normal. No murmurs, rubs, or gallops.  ABDOMEN: Soft, non-tender, non-distended. Bowel sounds present. No organomegaly or mass.  EXTREMITIES: No pedal edema, cyanosis, or clubbing.  NEUROLOGIC: Cranial nerves II through XII are intact. Muscle strength 5/5 in all extremities. Sensation intact. Gait not checked.  PSYCHIATRIC: The patient is alert and oriented x 3.  SKIN: No obvious rash, lesion, or ulcer.   DATA REVIEW:   CBC  Recent Labs Lab 03/19/17 0553  WBC 7.4  HGB 10.8*  HCT 30.1*  PLT 197    Chemistries   Recent Labs Lab 03/19/17 2240 03/20/17  0322  NA 133*  --   K 4.5  --   CL 96*  --   CO2 29  --   GLUCOSE 121*  --   BUN 9  --   CREATININE 0.89  --   CALCIUM 8.8*  --   MG  --  1.7     Microbiology Results  No results found for this or any previous visit.  RADIOLOGY:  No results found.   Management plans discussed with the patient, family and they are in agreement.  CODE STATUS:  Code Status History    Date Active Date Inactive Code Status Order ID Comments User Context   03/18/2017  4:54 PM 03/20/2017  6:01 PM Full Code [id number]  Katha Hamming, MD ED    Advance Directive Documentation     Most Recent Value  Type of Advance Directive  Living will  Pre-existing out of facility DNR order (yellow form or pink MOST form)  -  "MOST" Form in Place?  -      TOTAL TIME TAKING CARE OF THIS PATIENT: 37 minutes.    Jaamal Farooqui M.D on 03/21/2017 at 12:11 PM  Between 7am to 6pm  - Pager - [phone]  After 6pm go to [email or web address] - Scientist, research (life sciences) [facility]  Office  [phone]  CC: Primary care physician; Shroff, Clancy Gourd, MD   Note: This dictation was prepared with Dragon dictation along with smaller phrase technology. Any transcriptional errors that result from this process are unintentional.

## 2017-03-22 LAB — ECHOCARDIOGRAM COMPLETE
E/e' ratio: 0.69
FS: 28 % (ref 28–44)
Height: 66 in
IV/PV OW: 1.09
LA diam index: 1.99 cm/m2
LA vol A4C: 46.7 ml
LA vol: 49.9 mL
LASIZE: 42 mm
LAVOLIN: 23.6 mL/m2
LEFT ATRIUM END SYS DIAM: 42 mm
LV E/e'average: 0.69
LV TDI E'MEDIAL: 8.38
LVEEMED: 0.69
Lateral S' vel: 20.6 cm/s
MVPKEVEL: 5.77 m/s
PW: 10.2 mm — AB (ref 0.6–1.1)
TAPSE: 23.9 mm
Weight: 3280 oz

## 2017-03-30 ENCOUNTER — Other Ambulatory Visit: Payer: Self-pay | Admitting: Legal Medicine

## 2017-03-30 DIAGNOSIS — R928 Other abnormal and inconclusive findings on diagnostic imaging of breast: Secondary | ICD-10-CM

## 2017-03-30 DIAGNOSIS — N6002 Solitary cyst of left breast: Secondary | ICD-10-CM

## 2017-04-05 ENCOUNTER — Other Ambulatory Visit: Payer: Medicare Other

## 2017-04-08 ENCOUNTER — Ambulatory Visit
Admission: RE | Admit: 2017-04-08 | Discharge: 2017-04-08 | Disposition: A | Discharge status: Home / Self Care | Payer: Medicare Other | Source: Ambulatory Visit | Attending: Legal Medicine | Admitting: Legal Medicine

## 2017-04-08 DIAGNOSIS — R928 Other abnormal and inconclusive findings on diagnostic imaging of breast: Secondary | ICD-10-CM | POA: Diagnosis present

## 2017-04-08 DIAGNOSIS — N6002 Solitary cyst of left breast: Secondary | ICD-10-CM | POA: Diagnosis present

## 2017-04-08 DIAGNOSIS — N6321 Unspecified lump in the left breast, upper outer quadrant: Secondary | ICD-10-CM | POA: Insufficient documentation

## 2017-08-01 IMAGING — US US BREAST*R* LIMITED INC AXILLA
1 series · 5 of 5 positions shown · non-contrast
Comparison: Previous exam(s).

CLINICAL DATA: Screening recall for bilateral breast masses.

EXAM:
2D DIGITAL DIAGNOSTIC BILATERAL MAMMOGRAM WITH CAD AND ADJUNCT TOMO
BILATERAL BREAST ULTRASOUND

[Series 1: us breast*right* limited inc axilla · 0.07mm/px · 5 of 5 slices shown]
[im 1/5]
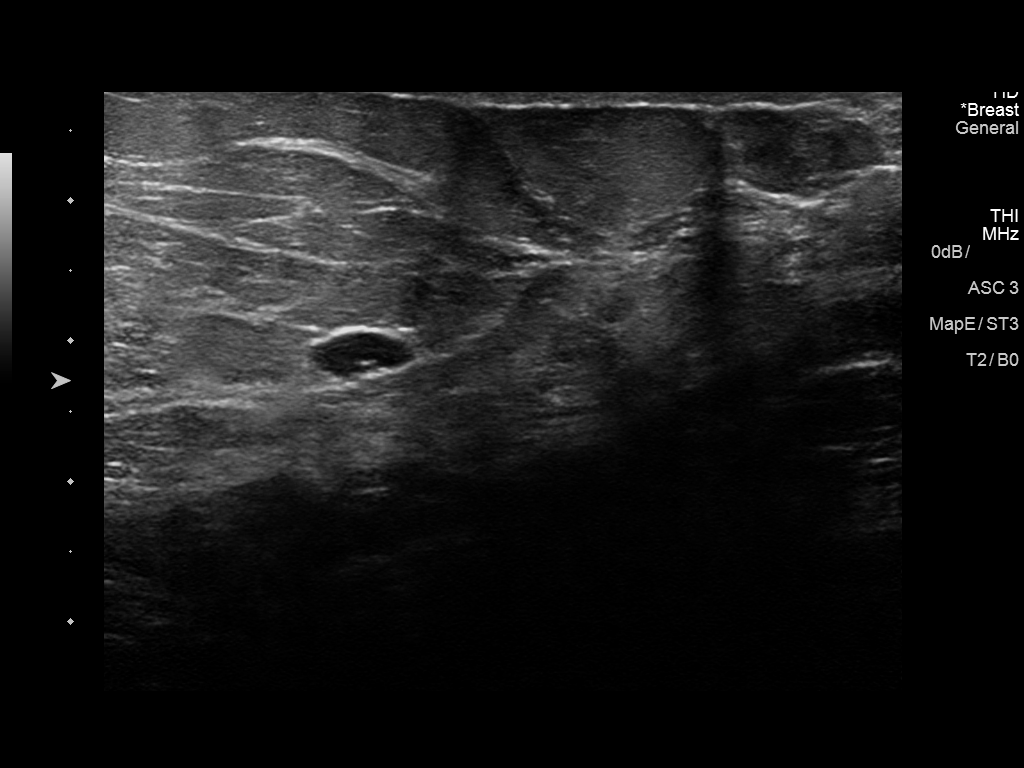
[im 2/5]
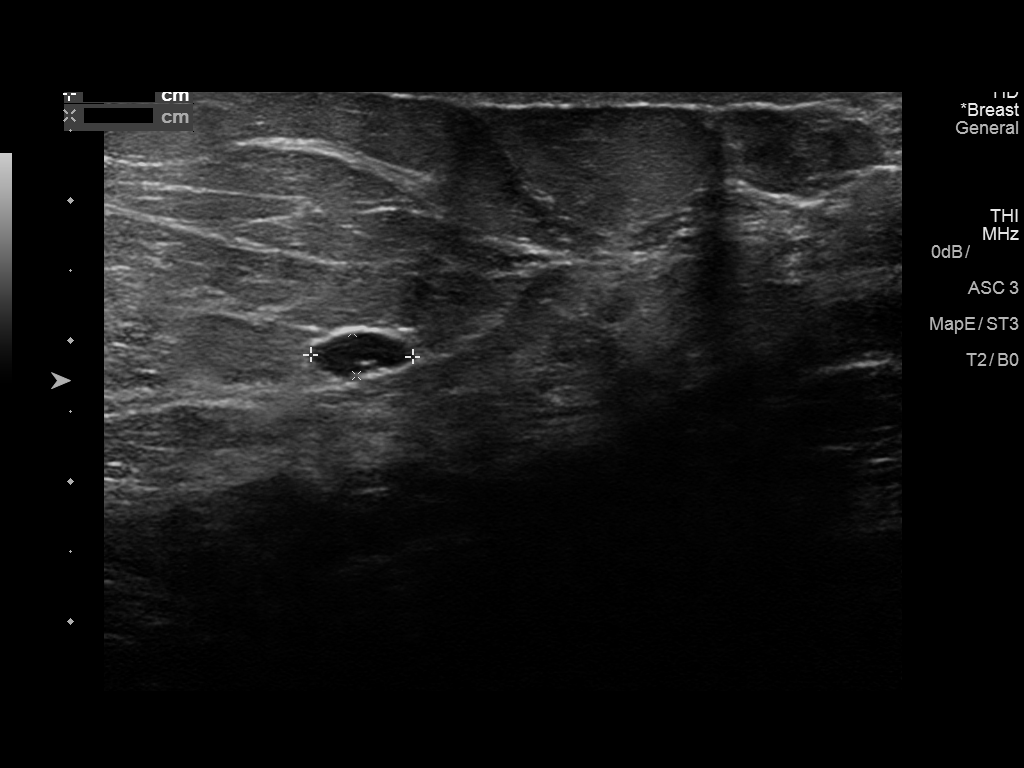
[im 3/5]
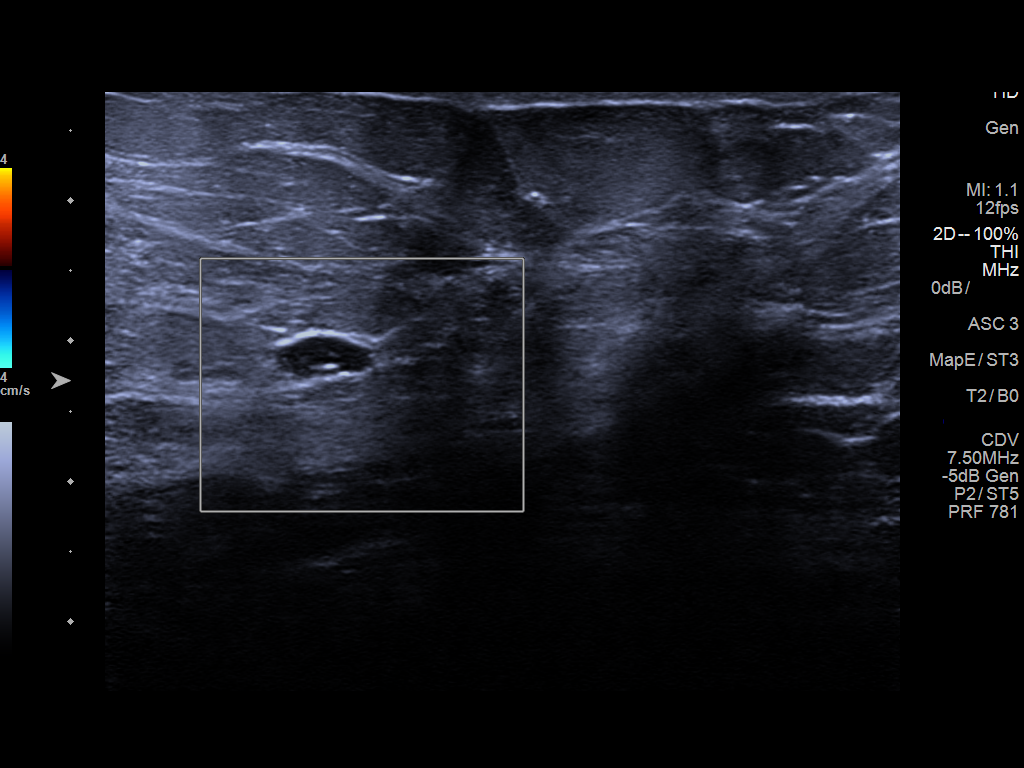
[im 4/5]
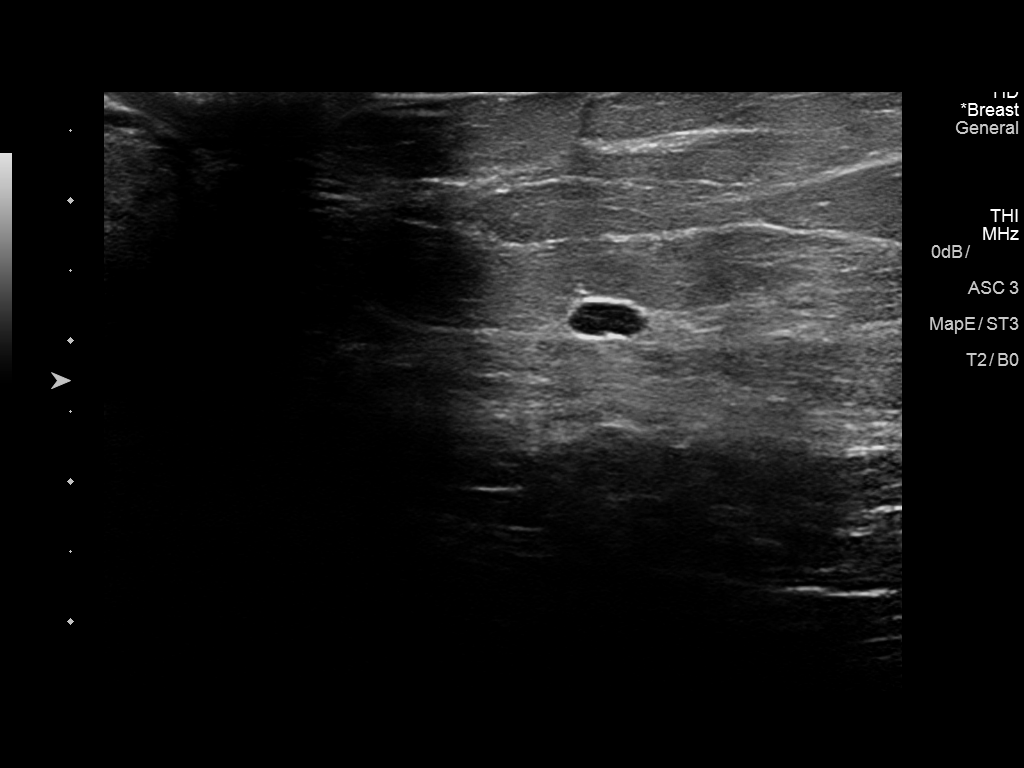
[im 5/5]
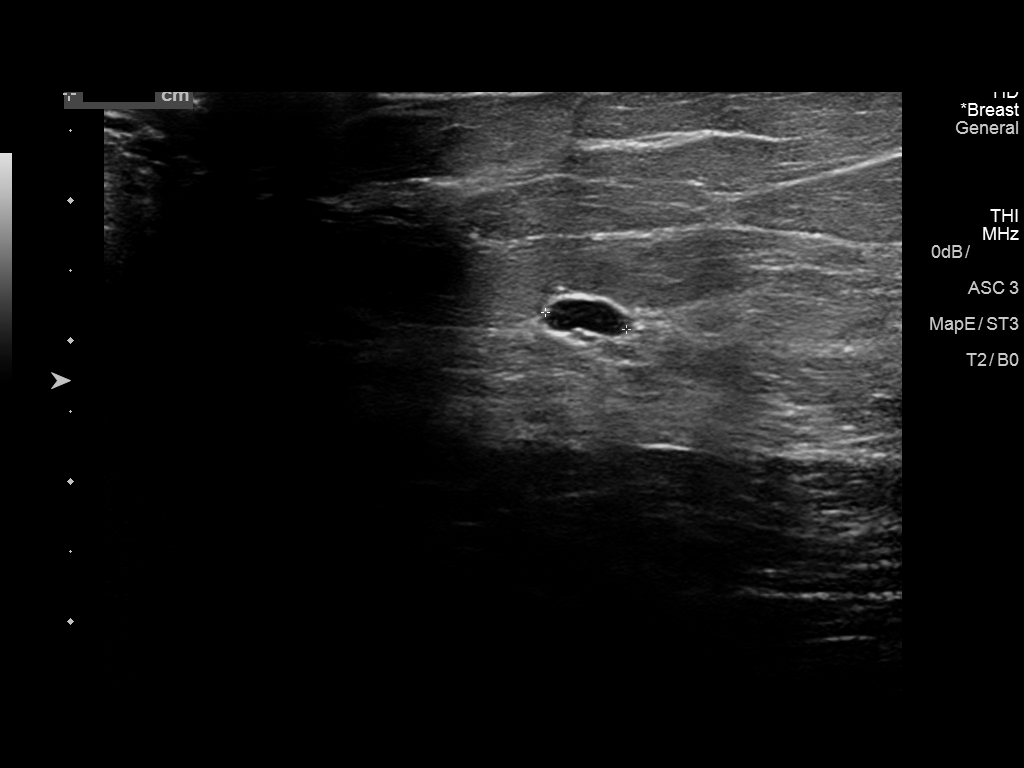

[5 of 5 positions shown; findings below may reference images not displayed]

ACR Breast Density Category b: There are scattered areas of
fibroglandular density.
FINDINGS: In the subareolar slightly medial right breast there is a bilobed
circumscribed mass measuring approximately 7 mm. In the upper-outer
quadrant of the left breast there is an irregular 1.1 cm mass with
indistinct margins. No other suspicious calcifications, masses or
areas of distortion are seen in the bilateral breasts.

Mammographic images were processed with CAD.

No palpable masses are identified in the bilateral breasts at the
site of the expected masses seen mammographically.

In the right breast at 2 o'clock, 1 cm from the nipple there is an
anechoic oval circumscribed mass measuring 7 x 5 x 3 mm,
corresponding with the mammographic mass of concern.

In the left breast at 1 o'clock, 5 cm from the nipple there is a
hypoechoic circumscribed mass measuring 1.1 x 0.3 x 0.6 cm,
corresponding with the mammographic mass of concern.
IMPRESSION: 1. The mass in the subareolar right breast at 2 o'clock corresponds
with a benign cyst.

2. The left breast mass at 1 o'clock is probably benign, most likely
representing a cluster of cysts.

RECOMMENDATION:
Six-month follow-up left breast ultrasound is recommended.

I have discussed the findings and recommendations with the patient.
Results were also provided in writing at the conclusion of the
visit. If applicable, a reminder letter will be sent to the patient
regarding the next appointment.

BI-RADS CATEGORY  3: Probably benign.

## 2017-08-06 ENCOUNTER — Encounter: Payer: Self-pay | Admitting: *Deleted

## 2017-08-06 ENCOUNTER — Ambulatory Visit
Admission: EM | Admit: 2017-08-06 | Discharge: 2017-08-06 | Disposition: A | Discharge status: Home / Self Care | Payer: Medicare Other | Attending: Family Medicine | Admitting: Family Medicine

## 2017-08-06 DIAGNOSIS — N390 Urinary tract infection, site not specified: Secondary | ICD-10-CM | POA: Diagnosis not present

## 2017-08-06 DIAGNOSIS — J449 Chronic obstructive pulmonary disease, unspecified: Secondary | ICD-10-CM | POA: Insufficient documentation

## 2017-08-06 DIAGNOSIS — R319 Hematuria, unspecified: Secondary | ICD-10-CM | POA: Insufficient documentation

## 2017-08-06 DIAGNOSIS — Z8673 Personal history of transient ischemic attack (TIA), and cerebral infarction without residual deficits: Secondary | ICD-10-CM | POA: Diagnosis not present

## 2017-08-06 DIAGNOSIS — G40409 Other generalized epilepsy and epileptic syndromes, not intractable, without status epilepticus: Secondary | ICD-10-CM | POA: Insufficient documentation

## 2017-08-06 DIAGNOSIS — Z87891 Personal history of nicotine dependence: Secondary | ICD-10-CM | POA: Insufficient documentation

## 2017-08-06 DIAGNOSIS — Z7982 Long term (current) use of aspirin: Secondary | ICD-10-CM | POA: Diagnosis not present

## 2017-08-06 DIAGNOSIS — Z79899 Other long term (current) drug therapy: Secondary | ICD-10-CM | POA: Diagnosis not present

## 2017-08-06 DIAGNOSIS — N183 Chronic kidney disease, stage 3 (moderate): Secondary | ICD-10-CM | POA: Insufficient documentation

## 2017-08-06 DIAGNOSIS — E039 Hypothyroidism, unspecified: Secondary | ICD-10-CM | POA: Diagnosis not present

## 2017-08-06 DIAGNOSIS — E782 Mixed hyperlipidemia: Secondary | ICD-10-CM | POA: Insufficient documentation

## 2017-08-06 DIAGNOSIS — K219 Gastro-esophageal reflux disease without esophagitis: Secondary | ICD-10-CM | POA: Diagnosis not present

## 2017-08-06 DIAGNOSIS — R350 Frequency of micturition: Secondary | ICD-10-CM | POA: Diagnosis present

## 2017-08-06 DIAGNOSIS — F419 Anxiety disorder, unspecified: Secondary | ICD-10-CM | POA: Diagnosis not present

## 2017-08-06 DIAGNOSIS — F329 Major depressive disorder, single episode, unspecified: Secondary | ICD-10-CM | POA: Insufficient documentation

## 2017-08-06 DIAGNOSIS — I129 Hypertensive chronic kidney disease with stage 1 through stage 4 chronic kidney disease, or unspecified chronic kidney disease: Secondary | ICD-10-CM | POA: Diagnosis not present

## 2017-08-06 LAB — URINALYSIS, COMPLETE (UACMP) WITH MICROSCOPIC
Glucose, UA: NEGATIVE mg/dL
Ketones, ur: NEGATIVE mg/dL
NITRITE: NEGATIVE
PH: 7 (ref 5.0–8.0)
Protein, ur: >300 mg/dL — AB
Specific Gravity, Urine: 1.015 (ref 1.005–1.030)

## 2017-08-06 MED ORDER — CEPHALEXIN 500 MG PO CAPS: 500.0000 mg | ORAL_CAPSULE | Freq: Two times a day (BID) | ORAL | 0 refills | Status: AC

## 2017-08-06 NOTE — ED Provider Notes (Addendum)
[facility] ____________________________________________  Time seen: Approximately 1059 am  I have reviewed the triage vital signs and the nursing notes.   HISTORY  Chief Complaint Urinary Frequency and Hematuria  HPI  Cheryl Huff is a 81 y.o. female presenting for evaluation of urinary frequency, urinary urgency has been present since last night. States she also saw some blood in her urine today. Denies burning with urination, but states urination is uncomfortable. Patient reports suprapubic pressure and sensation to continuously have to urinate. Denies other associated abdominal discomfort, back pain, flank pain, fevers, vomiting, diarrhea or constipation. Reports continues to eat and drink well. Denies known trigger, but states has been drinking as much water as normal in last 3 days. Denies recent sickness, recent antibiotic use or change in medications. Reports otherwise doing well. States current symptoms feels consistent with previous UTI, however states last UTI was 30 years ago.Denies recent sickness. Denies recent antibiotic use. Denies renal insufficiency. Denies other bleeding.  Shroff, Clancy Gourd, MD: PCP   Past Medical History:  Diagnosis Date  . Arthritis   . Asthma   . HLD (hyperlipidemia)   . HTN (hypertension)   . Seizures (HCC)   . Stroke [facility])     Patient Active Problem List   Diagnosis Date Noted  . Hyponatremia 03/18/2017  . Allergic state 11/26/2016  . Anxiety 11/26/2016  . Arthritis 11/26/2016  . Depression 11/26/2016  . GERD (gastroesophageal reflux disease) 11/26/2016  . Hyperlipidemia, unspecified 11/26/2016  . Hypokalemia 11/26/2016  . Hypothyroidism (acquired) 11/26/2016  . Obstructive lung disease (HCC) 11/26/2016  . Seizure disorder (HCC) 11/26/2016  . BRBPR (bright red blood per rectum) 03/27/2016  . Incontinence of feces 03/27/2016  . Abnormal PFT 03/04/2015  . COPD with asthma (HCC) 12/31/2014  . Dizziness, nonspecific  12/21/2014  . ANA positive 11/16/2014  . Abnormal ECG 11/13/2014  . Primary osteoarthritis involving multiple joints 10/15/2014  . Insomnia 06/18/2014  . Bradycardia 03/19/2014  . Obesity, Class I, BMI 30-34.9 12/25/2013  . Hyperlipidemia, mixed 11/14/2013  . Hypertensive kidney disease with chronic kidney disease stage III (HCC) 11/14/2013  . Hypothyroid 11/14/2013  . Extrinsic asthma with exacerbation 10/20/2013  . Joint pain 07/05/2013  . Asthma 03/30/2013  . Benign essential hypertension 03/30/2013  . Female stress incontinence 03/30/2013  . 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 ABDOMINAL HYSTERECTOMY     also one ovary taken  . APPENDECTOMY    . HIP SURGERY Right    3 pins put in from fall cracking hip  . suspension of bladder     1961     No current facility-administered medications for this encounter.   Current Outpatient Prescriptions:  .  amLODipine (NORVASC) 2.5 MG tablet, Take 1 tablet (2.5 mg total) by mouth daily., Disp: 30 tablet, Rfl: 2 .  aspirin EC 81 MG tablet, Take 81 mg by mouth., Disp: , Rfl:  .  Calcium Carbonate-Vitamin D3 600-400 MG-UNIT TABS, Take 1 tablet by mouth daily. , Disp: , Rfl:  .  citalopram (CELEXA) 10 MG tablet, Take 10 mg by mouth., Disp: , Rfl:  .  Coenzyme Q10 200 MG capsule, Take 200 mg by mouth daily. , Disp: , Rfl:  .  Fluticasone-Salmeterol (ADVAIR DISKUS) 100-50 MCG/DOSE AEPB, Inhale 1 puff into the lungs 2 (two) times daily. , Disp: , Rfl:  .  Garlic 1000 MG CAPS, Take 2,000 mg by mouth daily. , Disp: , Rfl:  .  Glucosamine-Chondroitin 500-400 MG CAPS, Take 1 capsule by mouth 3 (three) times daily. , Disp: , Rfl:  .  levETIRAcetam (KEPPRA) 500 MG tablet, Take 500 mg by mouth 2 (two) times daily. , Disp: , Rfl:  .  levothyroxine (SYNTHROID, LEVOTHROID) 88 MCG tablet, Take 88 mcg by mouth daily before breakfast. , Disp: , Rfl:  .  Multiple Vitamin (MULTIVITAMIN)  tablet, Take 1 tablet by mouth daily., Disp: , Rfl:  .  omeprazole (PRILOSEC) 40 MG capsule, Take 40 mg by mouth daily. , Disp: , Rfl:  .  Probiotic Product (ALIGN) 4 MG CAPS, Take 4 mg by mouth daily. , Disp: , Rfl:  .  simvastatin (ZOCOR) 20 MG tablet, Take 20 mg by mouth., Disp: , Rfl:  .  sodium chloride (OCEAN) 0.65 % nasal spray, Place into the nose., Disp: , Rfl:  .  Turmeric Curcumin 500 MG CAPS, Take 500 mg by mouth daily. , Disp: , Rfl:  .  vitamin E 400 UNIT capsule, Take 400 Units by mouth daily. , Disp: , Rfl:  .  acetaminophen (TYLENOL) 500 MG tablet, Take 1,000 mg by mouth every 8 (eight) hours as needed for mild pain or moderate pain. , Disp: , Rfl:  .  albuterol (PROVENTIL HFA;VENTOLIN HFA) 108 (90 Base) MCG/ACT inhaler, Inhale 2 puffs into the lungs every 6 (six) hours as needed for wheezing. , Disp: , Rfl:  .  benzonatate (TESSALON PERLES) 100 MG capsule, Take 1 capsule (100 mg total) by mouth 3 (three) times daily., Disp: 20 capsule, Rfl: 0 .  cephALEXin (KEFLEX) 500 MG capsule, Take 1 capsule (500 mg total) by mouth 2 (two) times daily., Disp: 14 capsule, Rfl: 0 .  Cholecalciferol (D 2000) 2000 units TABS, Take 2,000 Units by mouth daily. , Disp: , Rfl:  .  naproxen sodium (ANAPROX) 220 MG tablet, Take 220 mg by mouth daily., Disp: , Rfl:   Allergies Neomycin-bacitracin zn-polymyx  Family History  Problem Relation Age of Onset  . Pancreatic cancer Mother   . Breast cancer Neg Hx   . Prostate cancer Neg Hx   . Bladder Cancer Neg Hx   . Kidney cancer Neg Hx     Social History Social History  Substance Use Topics  . Smoking status: Former Smoker    Quit date: 11/02/1965  . Smokeless tobacco: Never Used  . Alcohol use Yes     Comment: 3x per week    Review of Systems Constitutional: No fever/chills Cardiovascular: Denies chest pain. Respiratory: Denies shortness of breath. Gastrointestinal: No abdominal pain.  No nausea, no vomiting.  No diarrhea.  No  constipation. Genitourinary: Positive for dysuria. Musculoskeletal: Negative for back pain. Skin: Negative for rash.  ____________________________________________   PHYSICAL EXAM:  VITAL SIGNS: ED Triage Vitals  Enc Vitals Group     BP 08/06/17 1014 137/69     Pulse Rate 08/06/17 1014 67     Resp 08/06/17 1014 16     Temp 08/06/17 1014 98.3 F (36.8 C)     Temp Source 08/06/17 1014 Oral     SpO2 08/06/17 1014 100 %     Weight 08/06/17 1015 197 lb (89.4 kg)     Height 08/06/17 1015  (1.626 m)     Head Circumference --      Peak Flow --      Pain Score 08/06/17 1016 5     Pain Loc --      Pain Edu? --  Excl. in GC? --     Constitutional: Alert and oriented. Well appearing and in no acute distress. Cardiovascular: Normal rate, regular rhythm. Grossly normal heart sounds.  Good peripheral circulation. Respiratory: Normal respiratory effort without tachypnea nor retractions. Breath sounds are clear and equal bilaterally. No wheezes, rales, rhonchi. Gastrointestinal: Minimal suprapubic pressure, abdomen otherwise soft and nontender. No CVA tenderness. Musculoskeletal:  No midline cervical, thoracic or lumbar tenderness to palpation.Steady gait. Neurologic:  Normal speech and language.Speech is normal. No gait instability.  Skin:  Skin is warm, dry Psychiatric: Mood and affect are normal. Speech and behavior are normal. Patient exhibits appropriate insight and judgment   ___________________________________________   LABS (all labs ordered are listed, but only abnormal results are displayed)  Labs Reviewed  URINALYSIS, COMPLETE (UACMP) WITH MICROSCOPIC - Abnormal; Notable for the following:       Result Value   Color, Urine RED (*)    APPearance CLOUDY (*)    Hgb urine dipstick LARGE (*)    Bilirubin Urine SMALL (*)    Protein, ur >300 (*)    Leukocytes, UA LARGE (*)    Squamous Epithelial / LPF 0-5 (*)    Bacteria, UA FEW (*)    All other components within  normal limits  URINE CULTURE    PROCEDURES Procedures     INITIAL IMPRESSION / ASSESSMENT AND PLAN / ED COURSE  Pertinent labs & imaging results that were available during my care of the patient were reviewed by me and considered in my medical decision making (see chart for details).  Appearing patient. No acute distress. Presents for evaluation of dysuria. Urinalysis reviewed, suspect UTI. Will culture urine. Will start patient on oral Keflex. Recommend for patient to follow up with primary care physician in one to 2 weeks for follow-up and recheck of urine to ensure infection as well as hematuria clears. Patient states that her PCP is in [place] and she may come to urgent care for recheck, again encouraged her primary follow-up but urgent care can recheck urine if needed. Discussed indication, risks and benefits of medications with patient.  Discussed follow up with Primary care physician this week. Discussed follow up and return parameters including no resolution or any worsening concerns. Patient verbalized understanding and agreed to plan.   ____________________________________________   FINAL CLINICAL IMPRESSION(S) / ED DIAGNOSES  Final diagnoses:  Urinary tract infection with hematuria, site unspecified  Hematuria, unspecified type     Discharge Medication List as of 08/06/2017 11:06 AM    START taking these medications   Details  cephALEXin (KEFLEX) 500 MG capsule Take 1 capsule (500 mg total) by mouth 2 (two) times daily., Starting Fri 08/06/2017, Until Fri 08/13/2017, Normal        Note: This dictation was prepared with Dragon dictation along with smaller phrase technology. Any transcriptional errors that result from this process are unintentional.           Renford Dills, NP 08/06/17 1247

## 2017-08-06 NOTE — ED Triage Notes (Signed)
Patient started having symptoms of urinary frequency, and blood in urine last PM. Patient reports last UTI 30 years ago.

## 2017-08-06 NOTE — Discharge Instructions (Signed)
Take medication as prescribed. Rest. Drink plenty of fluids.   As discussed, follow up and have urine rechecked to make sure blood and infection has resolved.  Follow up with your primary care physician this week as needed. Return to Urgent care for new or worsening concerns.

## 2017-08-09 LAB — URINE CULTURE

## 2017-10-30 ENCOUNTER — Encounter: Payer: Self-pay | Admitting: Gynecology

## 2017-10-30 ENCOUNTER — Other Ambulatory Visit: Payer: Self-pay

## 2017-10-30 ENCOUNTER — Ambulatory Visit
Admission: EM | Admit: 2017-10-30 | Discharge: 2017-10-30 | Disposition: A | Discharge status: Home / Self Care | Payer: Medicare Other | Attending: Family Medicine | Admitting: Family Medicine

## 2017-10-30 DIAGNOSIS — R062 Wheezing: Secondary | ICD-10-CM | POA: Diagnosis not present

## 2017-10-30 DIAGNOSIS — R05 Cough: Secondary | ICD-10-CM | POA: Diagnosis not present

## 2017-10-30 DIAGNOSIS — J4531 Mild persistent asthma with (acute) exacerbation: Secondary | ICD-10-CM

## 2017-10-30 MED ORDER — PREDNISONE 20 MG PO TABS: ORAL_TABLET | ORAL | 0 refills | Status: AC

## 2017-10-30 MED ORDER — DOXYCYCLINE HYCLATE 100 MG PO TABS: 100.0000 mg | ORAL_TABLET | Freq: Two times a day (BID) | ORAL | 0 refills | Status: AC

## 2017-10-30 NOTE — ED Triage Notes (Signed)
Patient c/o cough x couple days and ear pain. Per patient x 2 days ago travel from [place].

## 2017-10-30 NOTE — ED Provider Notes (Signed)
[facility]    CSN: [id number] Arrival date & time: 10/30/17  1114     History   Chief Complaint Chief Complaint  Patient presents with  . Cough    HPI Cheryl Huff is a 81 y.o. female.   The history is provided by the patient.  Cough  Associated symptoms: wheezing   URI  Presenting symptoms: congestion and cough   Severity:  Moderate Onset quality:  Sudden Duration:  5 days Timing:  Constant Progression:  Worsening Chronicity:  New Relieved by:  Nothing Ineffective treatments:  OTC medications Associated symptoms: wheezing   Risk factors: chronic respiratory disease (asthma) and sick contacts     Past Medical History:  Diagnosis Date  . Arthritis   . Asthma   . HLD (hyperlipidemia)   . HTN (hypertension)   . Seizures (HCC)   . Stroke [facility](HCC)     Patient Active Problem List   Diagnosis Date Noted  . Hyponatremia 03/18/2017  . Allergic state 11/26/2016  . Anxiety 11/26/2016  . Arthritis 11/26/2016  . Depression 11/26/2016  . GERD (gastroesophageal reflux disease) 11/26/2016  . Hyperlipidemia, unspecified 11/26/2016  . Hypokalemia 11/26/2016  . Hypothyroidism (acquired) 11/26/2016  . Obstructive lung disease (HCC) 11/26/2016  . Seizure disorder (HCC) 11/26/2016  . BRBPR (bright red blood per rectum) 03/27/2016  . Incontinence of feces 03/27/2016  . Abnormal PFT 03/04/2015  . COPD with asthma (HCC) 12/31/2014  . Dizziness, nonspecific 12/21/2014  . ANA positive 11/16/2014  . Abnormal ECG 11/13/2014  . Primary osteoarthritis involving multiple joints 10/15/2014  . Insomnia 06/18/2014  . Bradycardia 03/19/2014  . Obesity, Class I, BMI 30-34.9 12/25/2013  . Hyperlipidemia, mixed 11/14/2013  . Hypertensive kidney disease with chronic kidney disease stage III (HCC) 11/14/2013  . Hypothyroid 11/14/2013  . Extrinsic asthma with exacerbation 10/20/2013  . Joint pain 07/05/2013  . Asthma 03/30/2013  . Benign essential hypertension  03/30/2013  . Female stress incontinence 03/30/2013  . 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 ABDOMINAL HYSTERECTOMY     also one ovary taken  . APPENDECTOMY    . HIP SURGERY Right    3 pins put in from fall cracking hip  . suspension of bladder     1961    OB History    No data available       Home Medications    Prior to Admission medications   Medication Sig Start Date End Date Taking? Authorizing Provider  acetaminophen (TYLENOL) 500 MG tablet Take 1,000 mg by mouth every 8 (eight) hours as needed for mild pain or moderate pain.    Yes [provider]  albuterol (PROVENTIL HFA;VENTOLIN HFA) 108 (90 Base) MCG/ACT inhaler Inhale 2 puffs into the lungs every 6 (six) hours as needed for wheezing.  10/20/13  Yes [provider]  amLODipine (NORVASC) 2.5 MG tablet Take 1 tablet (2.5 mg total) by mouth daily. 03/20/17 03/20/18 Yes Enid BaasKalisetti, Radhika, MD  aspirin EC 81 MG tablet Take 81 mg by mouth.   Yes [provider]  Calcium Carbonate-Vitamin D3 600-400 MG-UNIT TABS Take 1 tablet by mouth daily.    Yes [provider]  Cholecalciferol (D 2000) 2000 units TABS Take 2,000 Units by mouth daily.    Yes [provider]  citalopram (CELEXA) 10 MG tablet Take 10 mg by mouth. 06/18/14  Yes [provider]  Coenzyme Q10 200 MG capsule Take 200 mg by mouth  daily.    Yes [provider]  Fluticasone-Salmeterol (ADVAIR DISKUS) 100-50 MCG/DOSE AEPB Inhale 1 puff into the lungs 2 (two) times daily.  10/20/13  Yes [provider]  Garlic 1000 MG CAPS Take 2,000 mg by mouth daily.    Yes [provider]  Glucosamine-Chondroitin 500-400 MG CAPS Take 1 capsule by mouth 3 (three) times daily.    Yes [provider]  levETIRAcetam (KEPPRA) 500 MG tablet Take 500 mg by mouth 2 (two) times daily.  08/04/16  Yes [provider]    levothyroxine (SYNTHROID, LEVOTHROID) 88 MCG tablet Take 88 mcg by mouth daily before breakfast.  08/04/16  Yes [provider]  Multiple Vitamin (MULTIVITAMIN) tablet Take 1 tablet by mouth daily.   Yes [provider]  naproxen sodium (ANAPROX) 220 MG tablet Take 220 mg by mouth daily.   Yes [provider]  omeprazole (PRILOSEC) 40 MG capsule Take 40 mg by mouth daily.  08/04/16  Yes [provider]  Probiotic Product (ALIGN) 4 MG CAPS Take 4 mg by mouth daily.    Yes [provider]  simvastatin (ZOCOR) 20 MG tablet Take 20 mg by mouth. 06/18/14  Yes [provider]  Turmeric Curcumin 500 MG CAPS Take 500 mg by mouth daily.    Yes [provider]  vitamin E 400 UNIT capsule Take 400 Units by mouth daily.    Yes [provider]  benzonatate (TESSALON PERLES) 100 MG capsule Take 1 capsule (100 mg total) by mouth 3 (three) times daily. 03/20/17   Enid Baas, MD  doxycycline (VIBRA-TABS) 100 MG tablet Take 1 tablet (100 mg total) by mouth 2 (two) times daily. 10/30/17   Payton Mccallum, MD  predniSONE (DELTASONE) 20 MG tablet 3 tabs po qd x 2 days, then 2 tabs po qd x 2 days, then 1 tab po qd x 2 days, then half a tab po qd x 2 days 10/30/17   Payton Mccallum, MD  sodium chloride (OCEAN) 0.65 % nasal spray Place into the nose.    [provider]    Family History Family History  Problem Relation Age of Onset  . Pancreatic cancer Mother   . Breast cancer Neg Hx   . Prostate cancer Neg Hx   . Bladder Cancer Neg Hx   . Kidney cancer Neg Hx     Social History Social History   Tobacco Use  . Smoking status: Former Smoker    Last attempt to quit: 11/02/1965    Years since quitting: 52.0  . Smokeless tobacco: Never Used  Substance Use Topics  . Alcohol use: Yes    Comment: 3x per week  . Drug use: No     Allergies   Neomycin-bacitracin zn-polymyx   Review of Systems Review of Systems  HENT:  Positive for congestion.   Respiratory: Positive for cough and wheezing.      Physical Exam Triage Vital Signs ED Triage Vitals  Enc Vitals Group     BP 10/30/17 1311 134/67     Pulse Rate 10/30/17 1311 69     Resp 10/30/17 1311 16     Temp 10/30/17 1311 98.5 F (36.9 C)     Temp Source 10/30/17 1310 Oral     SpO2 10/30/17 1311 95 %     Weight 10/30/17 1311 190 lb (86.2 kg)     Height 10/30/17 1311 5\' 4"  (1.626 m)     Head Circumference --  Peak Flow --      Pain Score 10/30/17 1311 4     Pain Loc --      Pain Edu? --      Excl. in GC? --    No data found.  Updated Vital Signs BP 134/67 (BP Location: Left Arm)   Pulse 69   Temp 98.5 F (36.9 C)   Resp 16   Ht 5\' 4"  (1.626 m)   Wt 190 lb (86.2 kg)   SpO2 95%   BMI 32.61 kg/m   Visual Acuity Right Eye Distance:   Left Eye Distance:   Bilateral Distance:    Right Eye Near:   Left Eye Near:    Bilateral Near:     Physical Exam  Constitutional: She appears well-developed and well-nourished. No distress.  HENT:  Head: Normocephalic and atraumatic.  Nose: No mucosal edema, rhinorrhea, nose lacerations, sinus tenderness, nasal deformity, septal deviation or nasal septal hematoma. No epistaxis.  No foreign bodies. Right sinus exhibits no maxillary sinus tenderness and no frontal sinus tenderness. Left sinus exhibits no maxillary sinus tenderness and no frontal sinus tenderness.  Mouth/Throat: Uvula is midline, oropharynx is clear and moist and mucous membranes are normal. No oropharyngeal exudate.  Neck: Normal range of motion. Neck supple. No thyromegaly present.  Cardiovascular: Normal rate, regular rhythm and normal heart sounds.  Pulmonary/Chest: Effort normal. No stridor. No respiratory distress. She has wheezes (diffuse, bilateral, expiratory). She has no rales.  Lymphadenopathy:    She has no cervical adenopathy.  Skin: She is not diaphoretic.  Nursing note and vitals reviewed.    UC Treatments /  Results  Labs (all labs ordered are listed, but only abnormal results are displayed) Labs Reviewed - No data to display  EKG  EKG Interpretation None       Radiology No results found.  Procedures Procedures (including critical care time)  Medications Ordered in UC Medications - No data to display   Initial Impression / Assessment and Plan / UC Course  I have reviewed the triage vital signs and the nursing notes.  Pertinent labs & imaging results that were available during my care of the patient were reviewed by me and considered in my medical decision making (see chart for details).       Final Clinical Impressions(s) / UC Diagnoses   Final diagnoses:  Mild persistent asthma with exacerbation    ED Discharge Orders        Ordered    doxycycline (VIBRA-TABS) 100 MG tablet  2 times daily     10/30/17 1358    predniSONE (DELTASONE) 20 MG tablet     10/30/17 1401     1. diagnosis reviewed with patient 2. rx as per orders above; reviewed possible side effects, interactions, risks and benefits  3. Recommend supportive treatment with rest, fluids 4. Follow-up prn if symptoms worsen or don't improve  Controlled Substance Prescriptions [place] Controlled Substance Registry consulted? Not Applicable   Payton Mccallumonty, Gracelyn Coventry, MD 10/30/17 1407

## 2018-01-03 ENCOUNTER — Other Ambulatory Visit: Payer: Self-pay | Admitting: Family Medicine

## 2018-01-03 DIAGNOSIS — Z1239 Encounter for other screening for malignant neoplasm of breast: Secondary | ICD-10-CM

## 2018-01-17 ENCOUNTER — Other Ambulatory Visit: Payer: Self-pay | Admitting: Family Medicine

## 2018-01-17 DIAGNOSIS — Z1239 Encounter for other screening for malignant neoplasm of breast: Secondary | ICD-10-CM

## 2018-01-18 ENCOUNTER — Ambulatory Visit
Admission: RE | Admit: 2018-01-18 | Discharge: 2018-01-18 | Disposition: A | Discharge status: Home / Self Care | Payer: Medicare HMO | Source: Ambulatory Visit | Attending: Family Medicine | Admitting: Family Medicine

## 2018-01-18 DIAGNOSIS — I10 Essential (primary) hypertension: Secondary | ICD-10-CM | POA: Insufficient documentation

## 2018-01-18 DIAGNOSIS — Z1231 Encounter for screening mammogram for malignant neoplasm of breast: Secondary | ICD-10-CM | POA: Diagnosis not present

## 2018-01-18 DIAGNOSIS — Z09 Encounter for follow-up examination after completed treatment for conditions other than malignant neoplasm: Secondary | ICD-10-CM | POA: Diagnosis not present

## 2018-01-18 DIAGNOSIS — Z1239 Encounter for other screening for malignant neoplasm of breast: Secondary | ICD-10-CM

## 2018-01-18 DIAGNOSIS — N6082 Other benign mammary dysplasias of left breast: Secondary | ICD-10-CM | POA: Diagnosis not present

## 2018-01-18 DIAGNOSIS — N6002 Solitary cyst of left breast: Secondary | ICD-10-CM | POA: Insufficient documentation

## 2018-05-21 IMAGING — US US BREAST*L* LIMITED INC AXILLA
1 series · 9 of 9 positions shown · non-contrast
Comparison: Previous exam(s).

CLINICAL DATA: Followup for probably benign left breast mass.

EXAM:
ULTRASOUND OF THE left BREAST

[Series 1: us breast*left* limited inc axilla · 0.06mm/px · 9 of 9 slices shown]
[im 1/9]
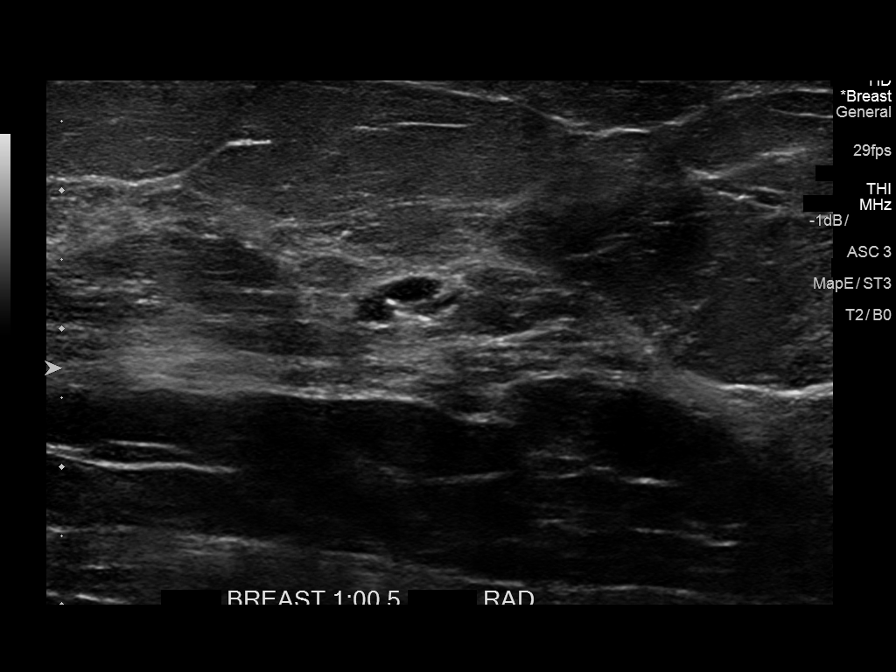
[im 2/9]
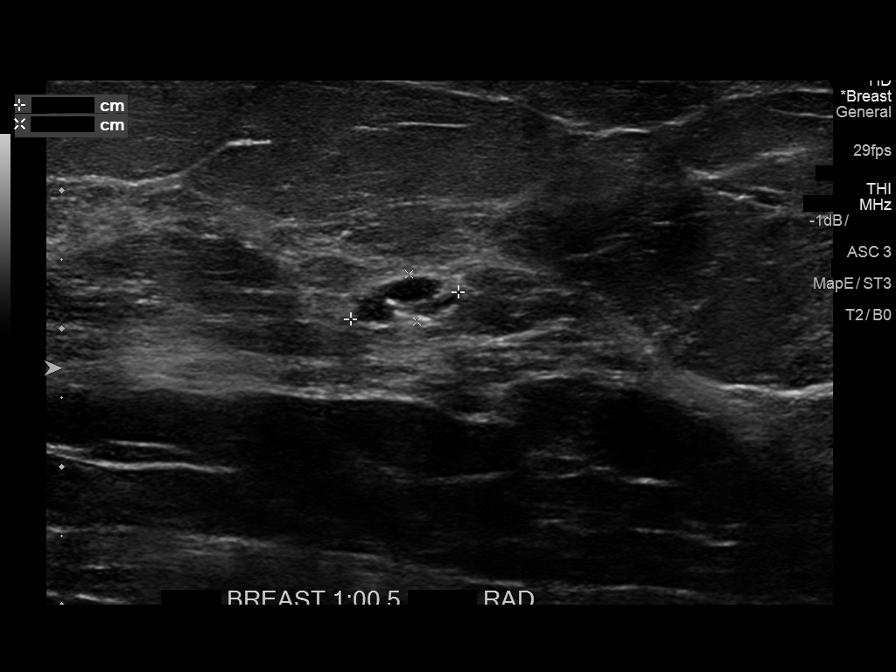
[im 3/9]
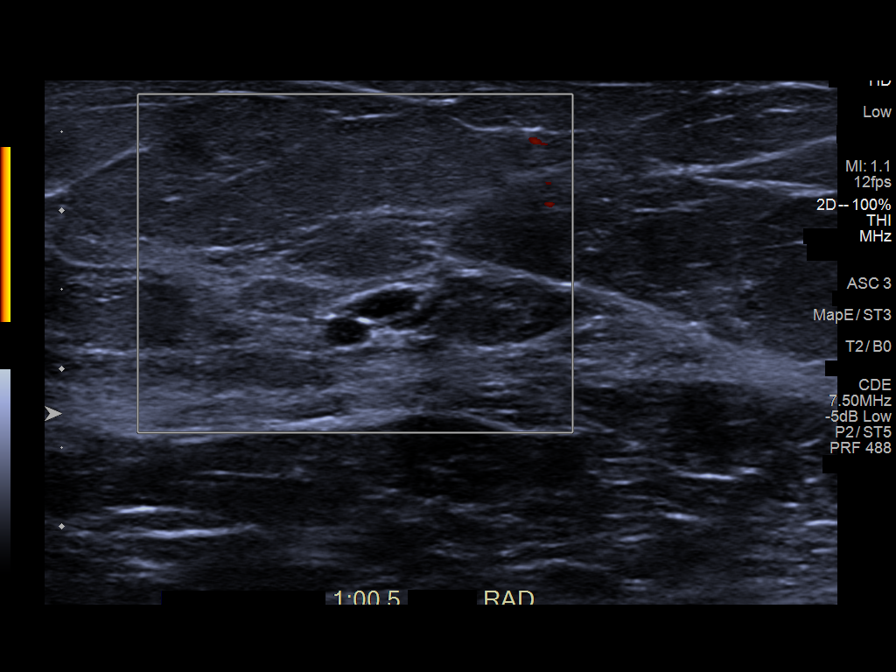
[im 4/9]
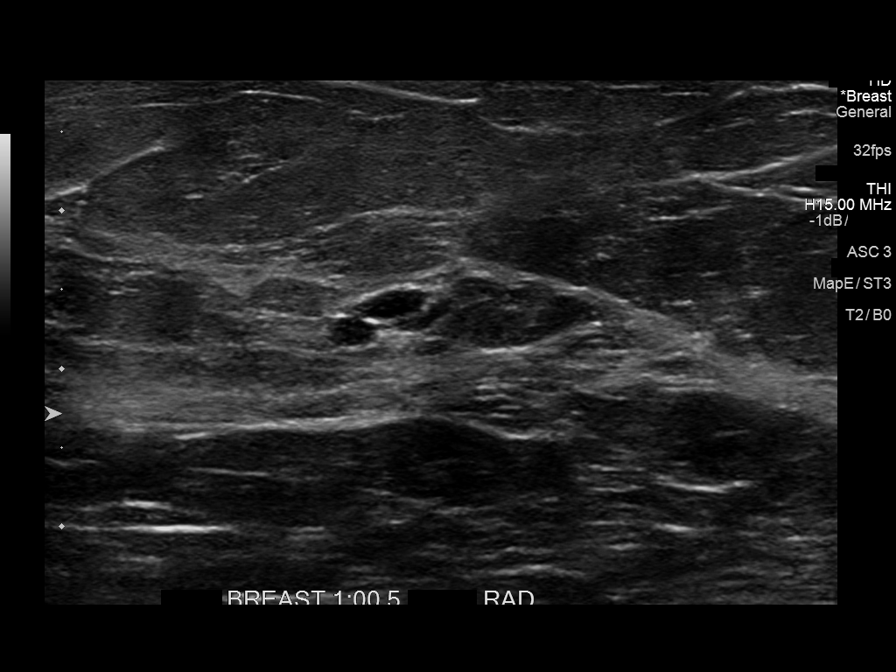
[im 5/9]
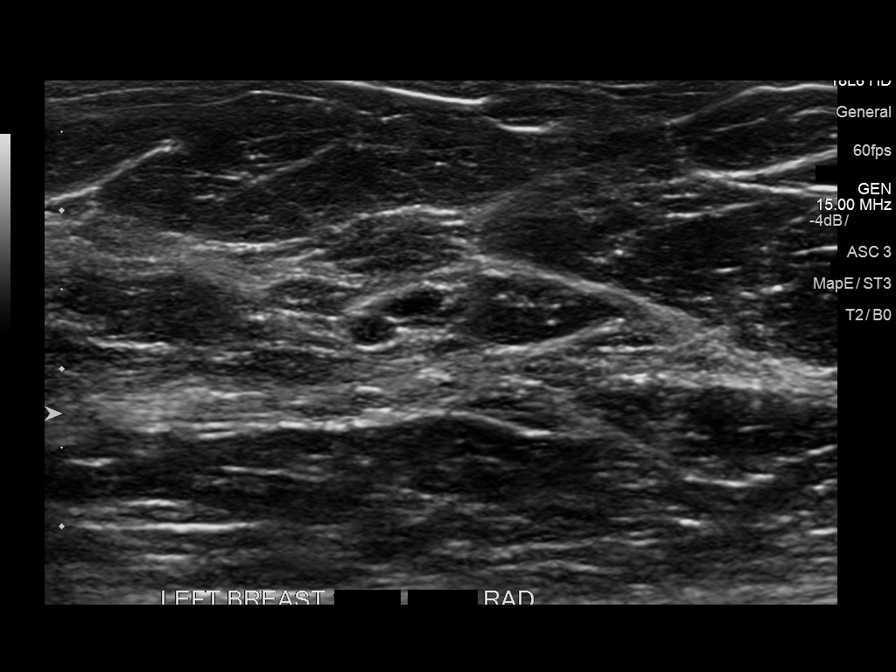
[im 6/9]
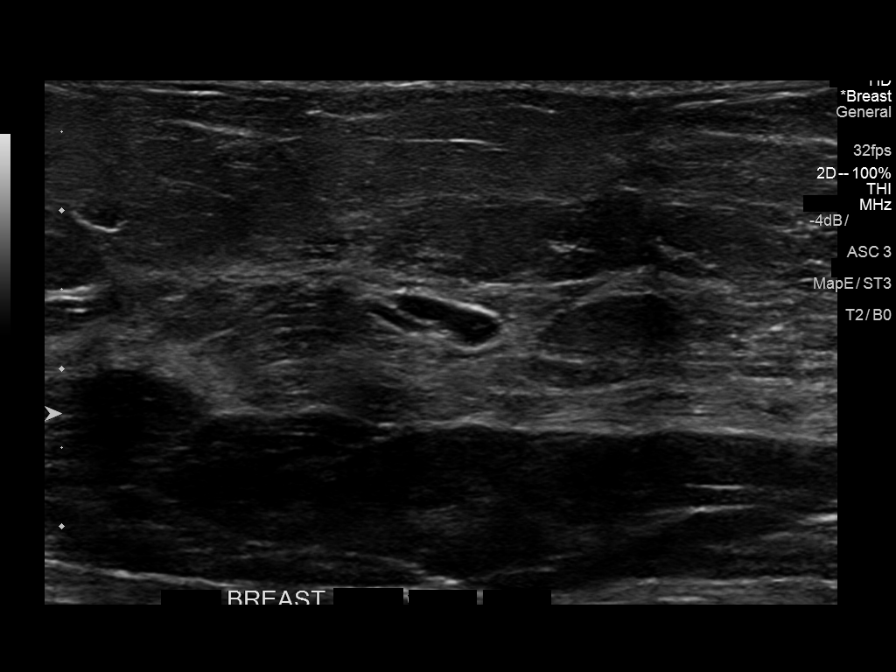
[im 7/9]
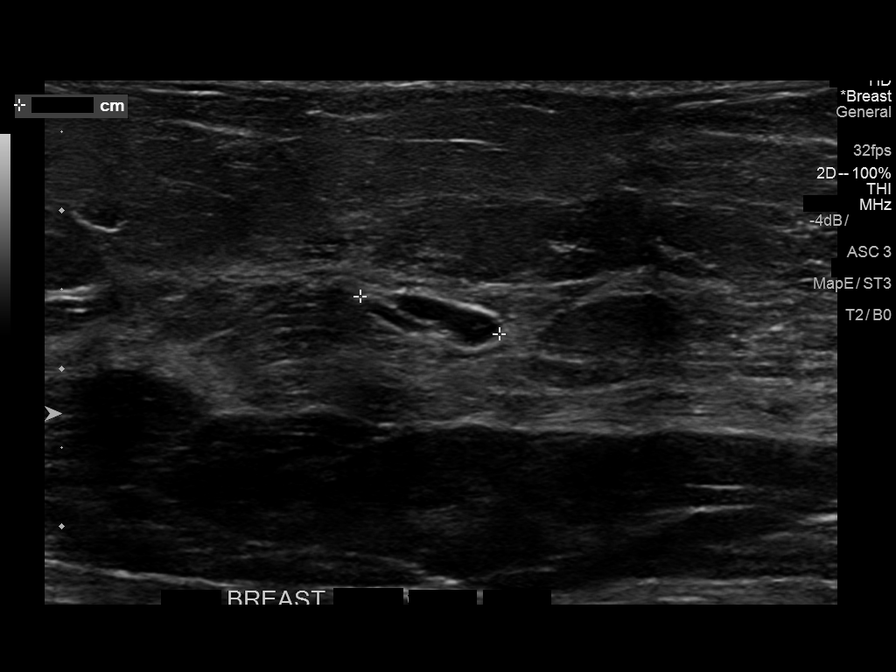
[im 8/9]
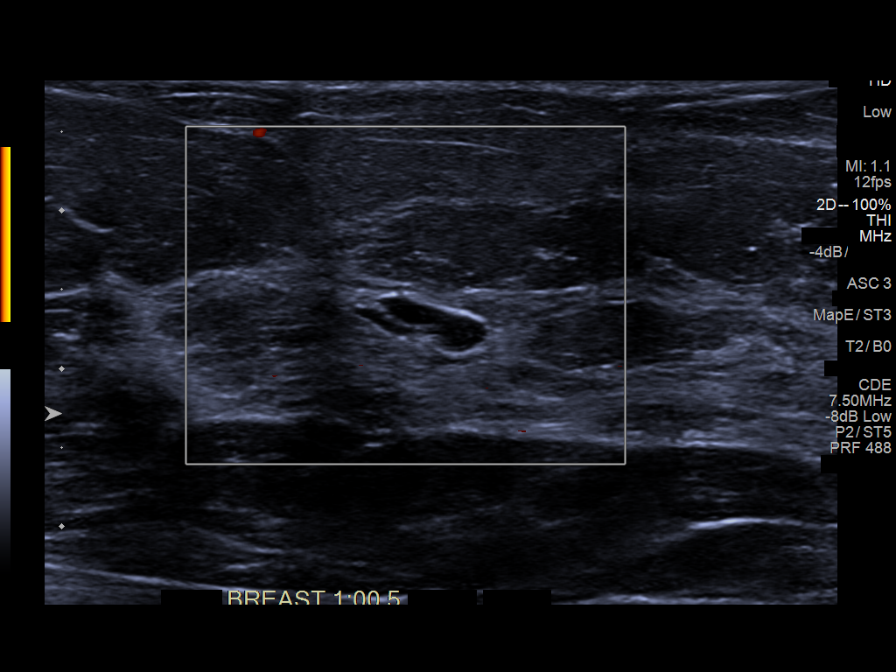
[im 9/9]
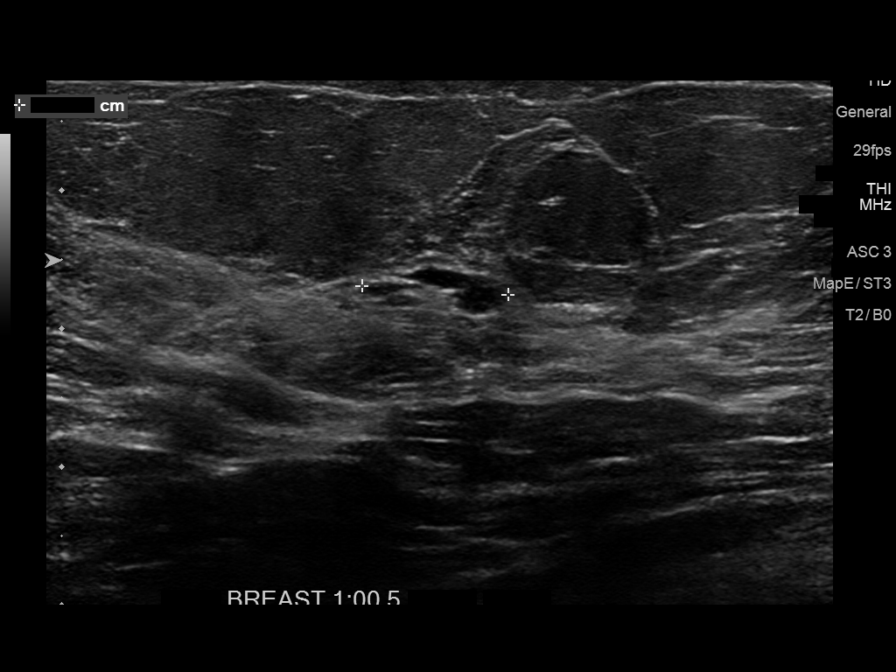

[9 of 9 positions shown; findings below may reference images not displayed]

FINDINGS: Targeted ultrasound of the left breast was performed demonstrating a
cluster of circumscribed anechoic masses consistent with a cluster
of cysts at 1 o'clock 5 cm from nipple measuring 1.1 x 0.4 x 0.8 cm,
overall unchanged in size and appearance given differences in
imaging and measurement technique.
IMPRESSION: Stable benign appearing left breast mass.

RECOMMENDATION:
Diagnostic mammography of the bilateral breasts with left breast
ultrasound [DATE] which will demonstrate 1 year stability of the
benign appearing left breast mass.

I have discussed the findings and recommendations with the patient.
Results were also provided in writing at the conclusion of the
visit. If applicable, a reminder letter will be sent to the patient
regarding the next appointment.

BI-RADS CATEGORY  3: Probably benign.
